# Patient Record
Sex: Male | Born: 1995 | Marital: Married | Smoking: Never smoker
Health system: Southern US, Community
[De-identification: ages and names within clinical notes are randomized; demographics above are authoritative.]

## PROBLEM LIST (undated history)

## (undated) DIAGNOSIS — F988 Other specified behavioral and emotional disorders with onset usually occurring in childhood and adolescence: Secondary | ICD-10-CM

## (undated) DIAGNOSIS — R471 Dysarthria and anarthria: Secondary | ICD-10-CM

## (undated) DIAGNOSIS — F32A Depression, unspecified: Secondary | ICD-10-CM

## (undated) DIAGNOSIS — G4700 Insomnia, unspecified: Secondary | ICD-10-CM

## (undated) DIAGNOSIS — S069XAA Unspecified intracranial injury with loss of consciousness status unknown, initial encounter: Secondary | ICD-10-CM

## (undated) DIAGNOSIS — F411 Generalized anxiety disorder: Secondary | ICD-10-CM

## (undated) DIAGNOSIS — J45909 Unspecified asthma, uncomplicated: Secondary | ICD-10-CM

## (undated) DIAGNOSIS — K219 Gastro-esophageal reflux disease without esophagitis: Secondary | ICD-10-CM

## (undated) DIAGNOSIS — S069X9A Unspecified intracranial injury with loss of consciousness of unspecified duration, initial encounter: Secondary | ICD-10-CM

## (undated) HISTORY — DX: Depression, unspecified: F32.A

## (undated) HISTORY — DX: Other specified behavioral and emotional disorders with onset usually occurring in childhood and adolescence: F98.8

## (undated) HISTORY — DX: Dysarthria and anarthria: R47.1

## (undated) HISTORY — DX: Unspecified intracranial injury with loss of consciousness of unspecified duration, initial encounter: S06.9X9A

## (undated) HISTORY — DX: Gastro-esophageal reflux disease without esophagitis: K21.9

## (undated) HISTORY — PX: DENTAL SURGERY: SHX609

## (undated) HISTORY — DX: Unspecified intracranial injury with loss of consciousness status unknown, initial encounter: S06.9XAA

## (undated) HISTORY — PX: OTHER SURGICAL HISTORY: SHX169

## (undated) HISTORY — DX: Insomnia, unspecified: G47.00

## (undated) HISTORY — DX: Generalized anxiety disorder: F41.1

---

## 2015-01-24 IMAGING — US US ABDOMEN COMPLETE
1 series · 14 of 25 positions shown · non-contrast
Comparison: None.

CLINICAL DATA: Right upper quadrant abdominal pain

EXAM:
ABDOMEN ULTRASOUND COMPLETE

[Series 1: us abdomen complete · 0.19mm/px · 14 of 97 slices shown]
[im 1/97]
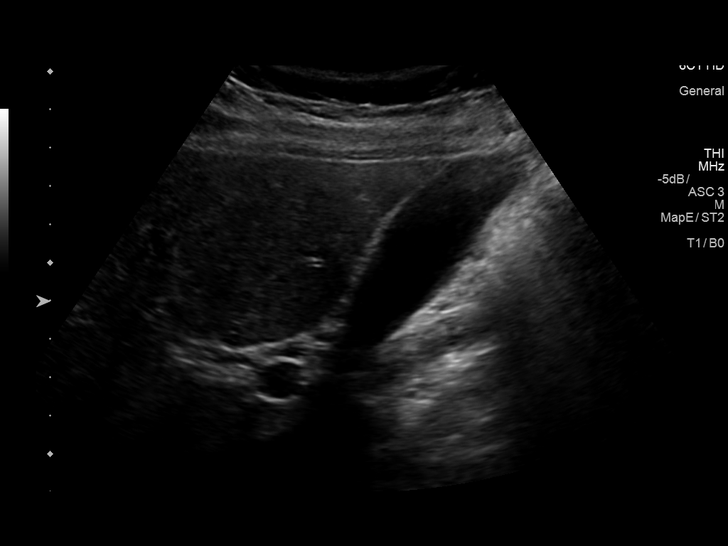
[im 9/97]
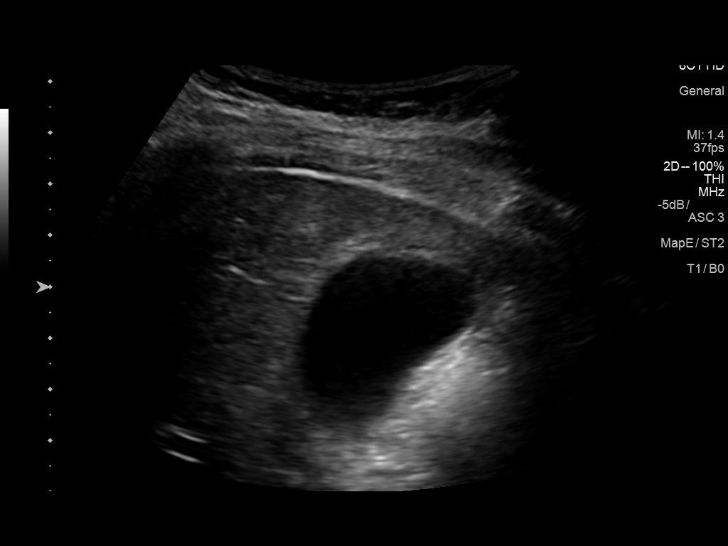
[im 17/97]
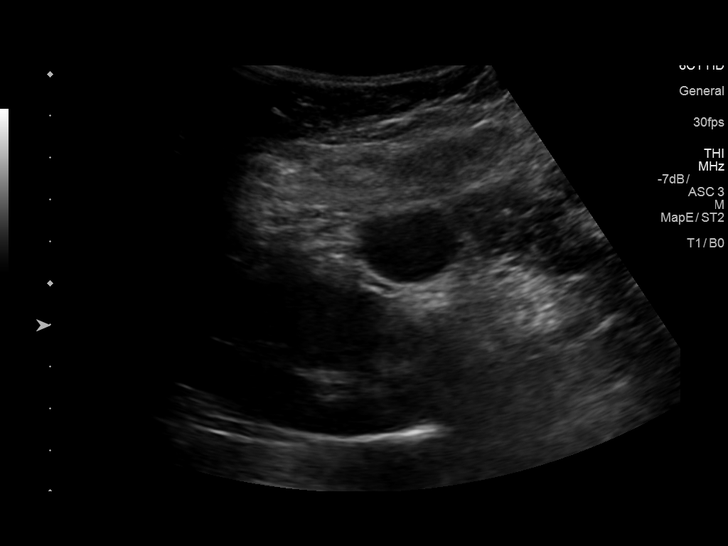
[im 25/97]
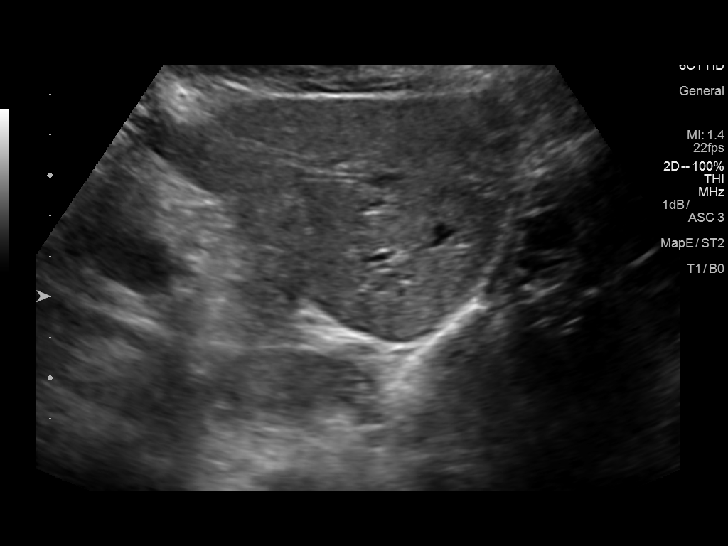
[im 33/97]
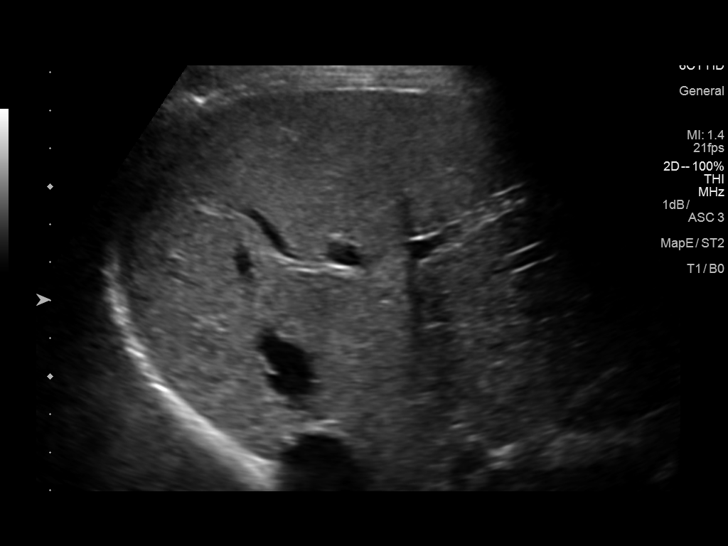
[im 37/97]
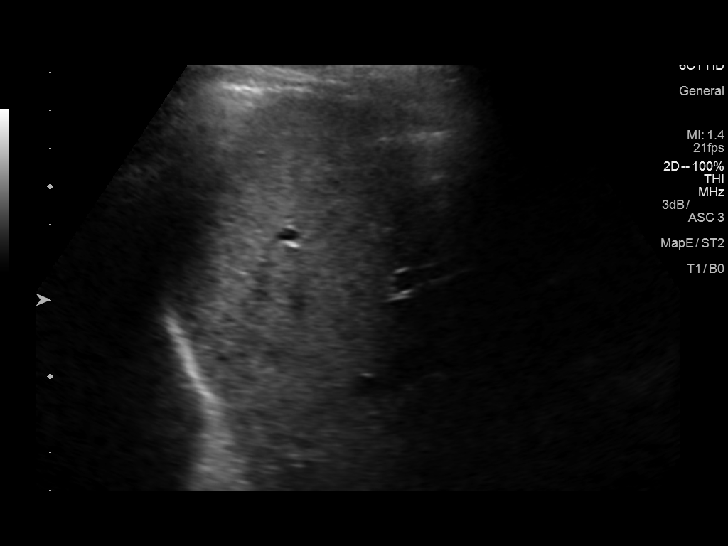
[im 45/97]
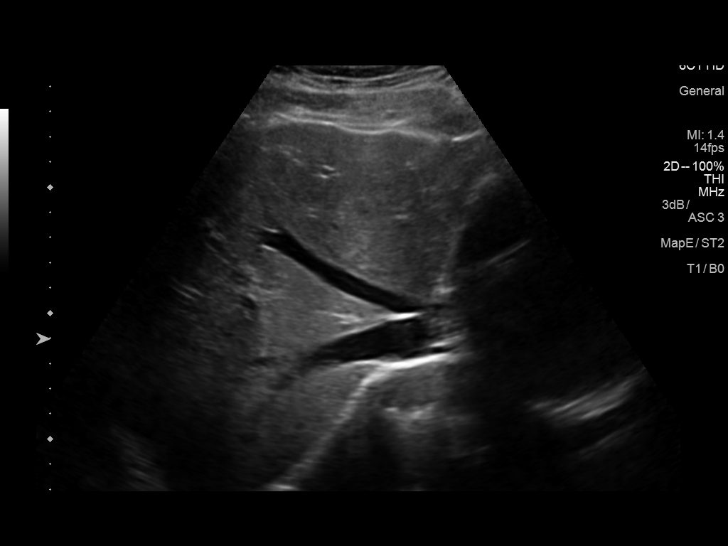
[im 53/97]
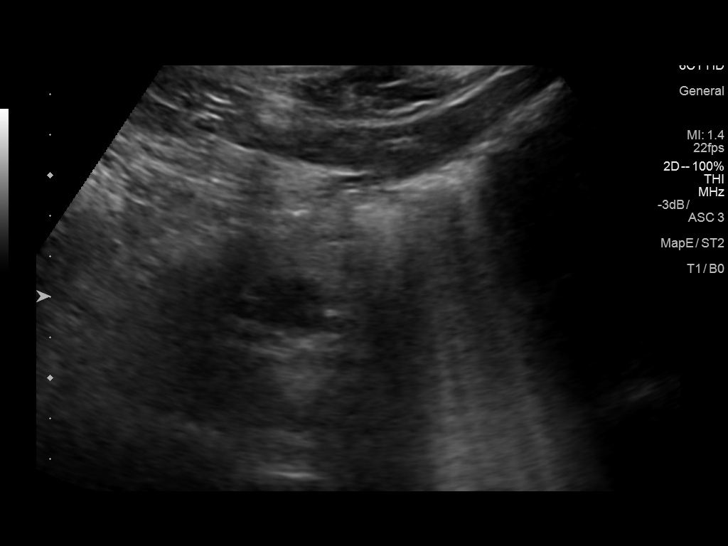
[im 61/97]
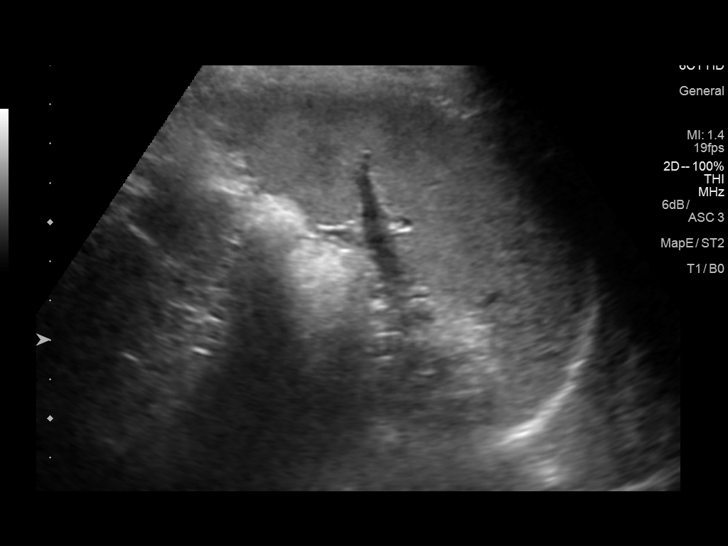
[im 65/97]
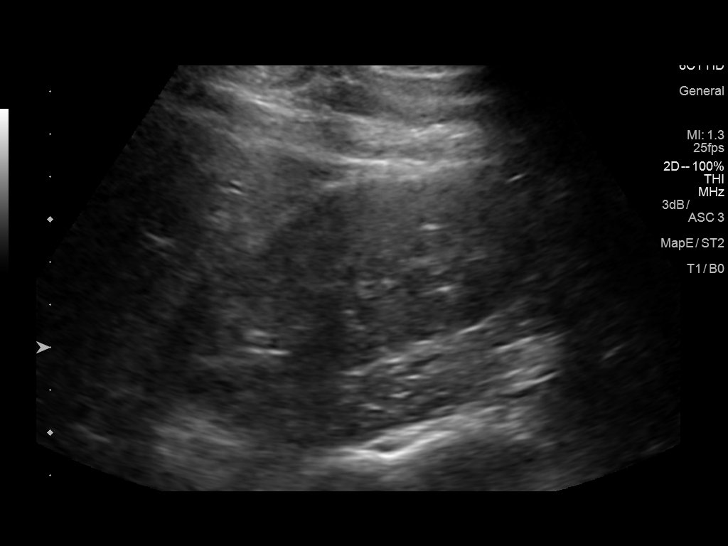
[im 73/97]
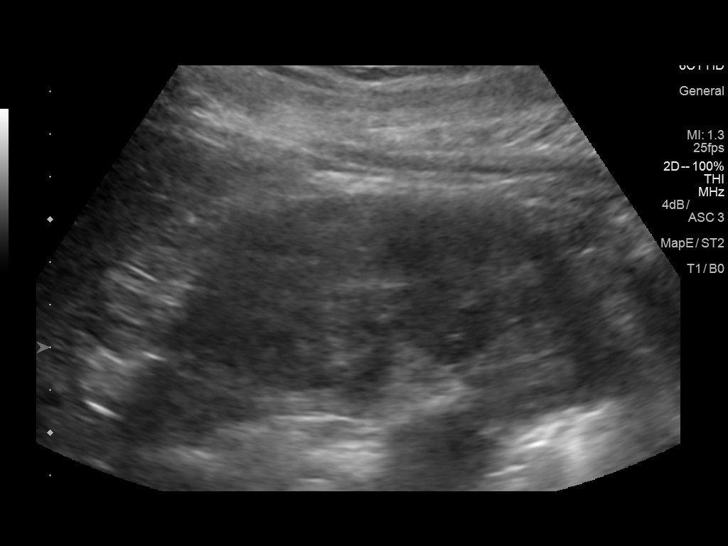
[im 81/97]
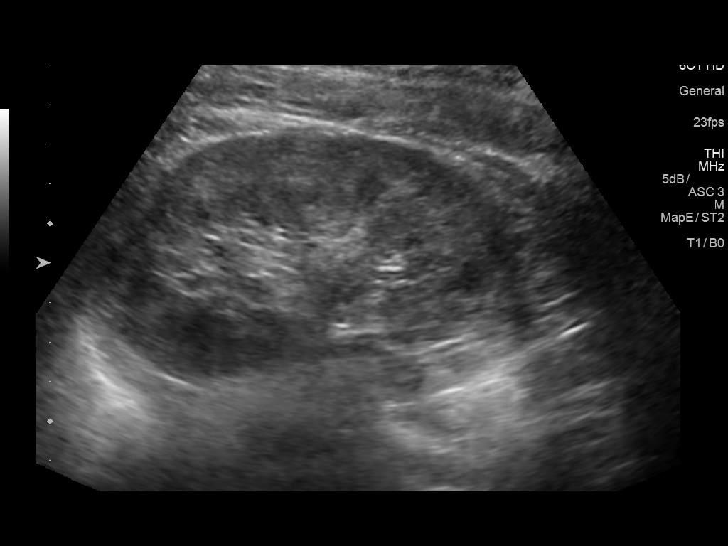
[im 89/97]
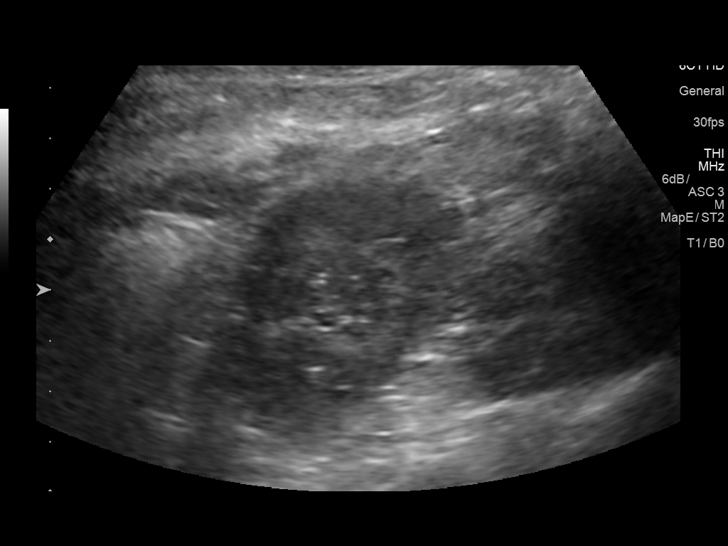
[im 97/97]
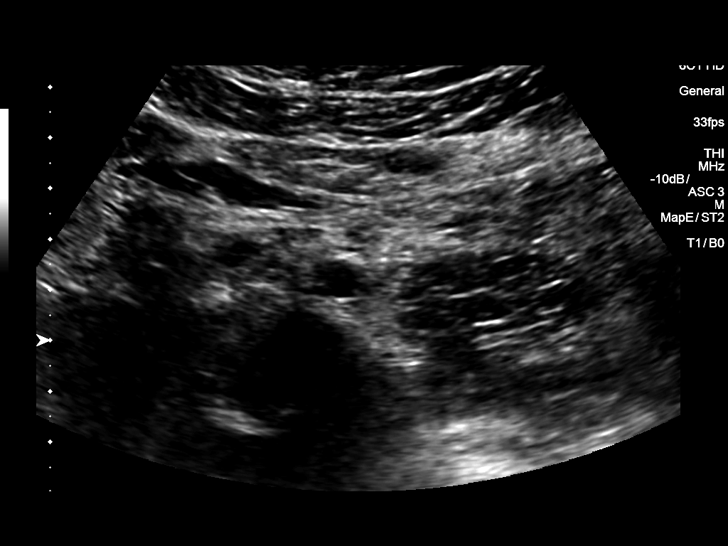

[14 of 25 positions shown; findings below may reference images not displayed]

FINDINGS: Gallbladder: No gallstones or wall thickening visualized. No
sonographic Murphy sign noted by sonographer.

Common bile duct: Diameter: 4.1 mm.

Liver: No focal lesion identified. Within normal limits in
parenchymal echogenicity. Portal flow is within normal limits.

IVC: No abnormality visualized.

Pancreas: Visualized portion unremarkable.

Spleen: Size and appearance within normal limits.

Right Kidney: Length: 11 cm. Echogenicity within normal limits. No
mass or hydronephrosis visualized.

Left Kidney: Length: 10.8 cm. Echogenicity within normal limits. No
mass or hydronephrosis visualized.

Abdominal aorta: No aneurysm visualized.

Other findings: None.
IMPRESSION: No acute abnormality noted.

## 2016-08-06 ENCOUNTER — Other Ambulatory Visit: Payer: Self-pay | Admitting: Family Medicine

## 2016-08-06 DIAGNOSIS — R1011 Right upper quadrant pain: Secondary | ICD-10-CM

## 2016-08-14 ENCOUNTER — Ambulatory Visit
Admission: RE | Admit: 2016-08-14 | Discharge: 2016-08-14 | Disposition: A | Discharge status: Home / Self Care | Payer: 59 | Source: Ambulatory Visit | Attending: Family Medicine | Admitting: Family Medicine

## 2016-08-14 DIAGNOSIS — R1011 Right upper quadrant pain: Secondary | ICD-10-CM

## 2019-01-16 ENCOUNTER — Other Ambulatory Visit: Payer: Self-pay

## 2019-01-16 DIAGNOSIS — Z20822 Contact with and (suspected) exposure to covid-19: Secondary | ICD-10-CM

## 2019-01-18 ENCOUNTER — Telehealth: Payer: Self-pay

## 2019-01-18 LAB — NOVEL CORONAVIRUS, NAA: SARS-CoV-2, NAA: NOT DETECTED

## 2019-01-18 NOTE — Telephone Encounter (Signed)
Pt called for covid 19 results. Advised pt results have not come back.

## 2019-01-19 ENCOUNTER — Telehealth: Payer: Self-pay | Admitting: Family Medicine

## 2019-01-19 NOTE — Telephone Encounter (Signed)
Negative COVID results given. Patient results "NOT Detected." Caller expressed understanding. ° °

## 2019-03-11 ENCOUNTER — Other Ambulatory Visit: Payer: Self-pay

## 2019-03-11 DIAGNOSIS — Z20822 Contact with and (suspected) exposure to covid-19: Secondary | ICD-10-CM

## 2019-03-13 LAB — NOVEL CORONAVIRUS, NAA: SARS-CoV-2, NAA: NOT DETECTED

## 2019-03-25 ENCOUNTER — Ambulatory Visit: Payer: 59 | Attending: Internal Medicine

## 2019-03-25 DIAGNOSIS — Z20822 Contact with and (suspected) exposure to covid-19: Secondary | ICD-10-CM

## 2019-03-26 LAB — NOVEL CORONAVIRUS, NAA: SARS-CoV-2, NAA: NOT DETECTED

## 2019-03-30 ENCOUNTER — Ambulatory Visit: Payer: 59 | Attending: Internal Medicine

## 2019-03-30 DIAGNOSIS — Z20822 Contact with and (suspected) exposure to covid-19: Secondary | ICD-10-CM

## 2019-04-01 LAB — NOVEL CORONAVIRUS, NAA: SARS-CoV-2, NAA: NOT DETECTED

## 2019-04-06 ENCOUNTER — Other Ambulatory Visit: Payer: 59

## 2019-04-06 ENCOUNTER — Ambulatory Visit: Payer: No Typology Code available for payment source | Attending: Internal Medicine

## 2019-04-06 DIAGNOSIS — Z20822 Contact with and (suspected) exposure to covid-19: Secondary | ICD-10-CM | POA: Insufficient documentation

## 2019-04-07 LAB — NOVEL CORONAVIRUS, NAA: SARS-CoV-2, NAA: NOT DETECTED

## 2019-04-20 ENCOUNTER — Other Ambulatory Visit: Payer: Self-pay

## 2019-05-14 DIAGNOSIS — M7731 Calcaneal spur, right foot: Secondary | ICD-10-CM | POA: Diagnosis not present

## 2019-05-14 DIAGNOSIS — M7732 Calcaneal spur, left foot: Secondary | ICD-10-CM | POA: Diagnosis not present

## 2019-05-14 DIAGNOSIS — M722 Plantar fascial fibromatosis: Secondary | ICD-10-CM | POA: Diagnosis not present

## 2019-05-22 DIAGNOSIS — M722 Plantar fascial fibromatosis: Secondary | ICD-10-CM | POA: Diagnosis not present

## 2019-05-28 DIAGNOSIS — F329 Major depressive disorder, single episode, unspecified: Secondary | ICD-10-CM | POA: Diagnosis not present

## 2019-05-28 DIAGNOSIS — F900 Attention-deficit hyperactivity disorder, predominantly inattentive type: Secondary | ICD-10-CM | POA: Diagnosis not present

## 2019-05-28 DIAGNOSIS — M71571 Other bursitis, not elsewhere classified, right ankle and foot: Secondary | ICD-10-CM | POA: Diagnosis not present

## 2019-05-28 DIAGNOSIS — M71572 Other bursitis, not elsewhere classified, left ankle and foot: Secondary | ICD-10-CM | POA: Diagnosis not present

## 2019-05-28 DIAGNOSIS — M722 Plantar fascial fibromatosis: Secondary | ICD-10-CM | POA: Diagnosis not present

## 2019-05-28 DIAGNOSIS — F411 Generalized anxiety disorder: Secondary | ICD-10-CM | POA: Diagnosis not present

## 2019-06-09 DIAGNOSIS — M722 Plantar fascial fibromatosis: Secondary | ICD-10-CM | POA: Diagnosis not present

## 2019-08-16 ENCOUNTER — Emergency Department (HOSPITAL_COMMUNITY): Payer: BC Managed Care – PPO

## 2019-08-16 ENCOUNTER — Emergency Department (HOSPITAL_COMMUNITY)
Admission: EM | Admit: 2019-08-16 | Discharge: 2019-08-16 | Disposition: A | Discharge status: Home / Self Care | Payer: BC Managed Care – PPO | Attending: Emergency Medicine | Admitting: Emergency Medicine

## 2019-08-16 ENCOUNTER — Encounter (HOSPITAL_COMMUNITY): Payer: Self-pay

## 2019-08-16 ENCOUNTER — Other Ambulatory Visit: Payer: Self-pay

## 2019-08-16 DIAGNOSIS — S0990XA Unspecified injury of head, initial encounter: Secondary | ICD-10-CM | POA: Insufficient documentation

## 2019-08-16 DIAGNOSIS — Y998 Other external cause status: Secondary | ICD-10-CM | POA: Insufficient documentation

## 2019-08-16 DIAGNOSIS — S060X9A Concussion with loss of consciousness of unspecified duration, initial encounter: Secondary | ICD-10-CM

## 2019-08-16 DIAGNOSIS — M542 Cervicalgia: Secondary | ICD-10-CM | POA: Diagnosis not present

## 2019-08-16 DIAGNOSIS — Y92410 Unspecified street and highway as the place of occurrence of the external cause: Secondary | ICD-10-CM | POA: Insufficient documentation

## 2019-08-16 DIAGNOSIS — J45909 Unspecified asthma, uncomplicated: Secondary | ICD-10-CM | POA: Diagnosis not present

## 2019-08-16 DIAGNOSIS — R519 Headache, unspecified: Secondary | ICD-10-CM

## 2019-08-16 DIAGNOSIS — Y9389 Activity, other specified: Secondary | ICD-10-CM | POA: Insufficient documentation

## 2019-08-16 DIAGNOSIS — S060XAA Concussion with loss of consciousness status unknown, initial encounter: Secondary | ICD-10-CM

## 2019-08-16 DIAGNOSIS — S199XXA Unspecified injury of neck, initial encounter: Secondary | ICD-10-CM | POA: Diagnosis not present

## 2019-08-16 HISTORY — DX: Unspecified asthma, uncomplicated: J45.909

## 2019-08-16 HISTORY — DX: Concussion with loss of consciousness of unspecified duration, initial encounter: S06.0X9A

## 2019-08-16 HISTORY — DX: Concussion with loss of consciousness status unknown, initial encounter: S06.0XAA
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
## 2019-08-16 IMAGING — MR MR MRA HEAD W/O CM
1 series · 20 of 48 positions shown · non-contrast
Comparison: None.

CLINICAL DATA: Head trauma with headache

EXAM:
MRA HEAD WITHOUT CONTRAST
TECHNIQUE: Angiographic images of the Circle of Willis were obtained using MRA
technique without intravenous contrast.

[Series 8: TOF · axial · 0.6mm · 0.35mm/px · z∈[-55,+43]mm · 20 of 172 slices shown]
[im 1/172]
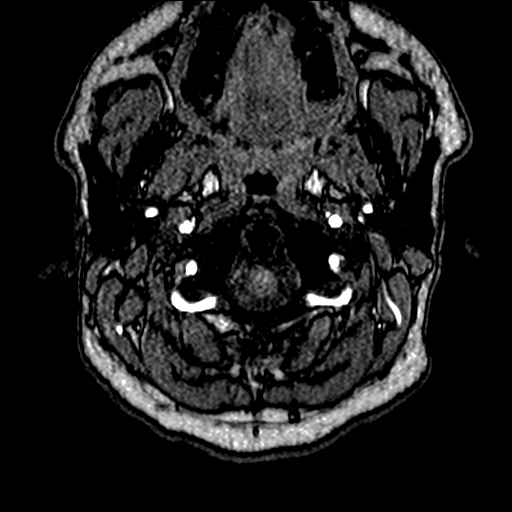
[im 4/172]
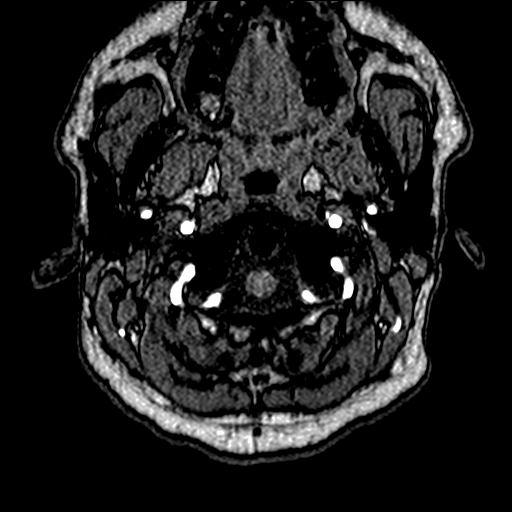
[im 8/172]
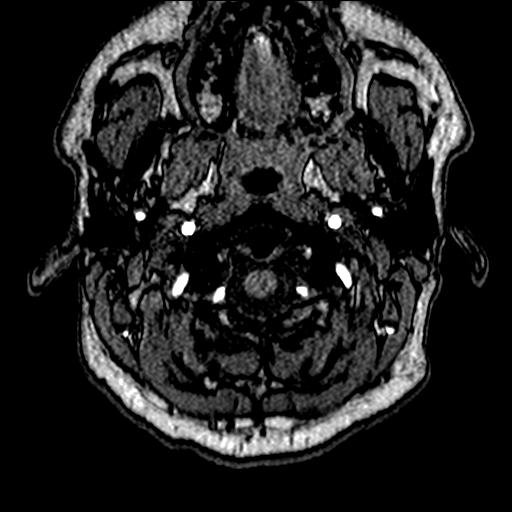
[im 11/172]
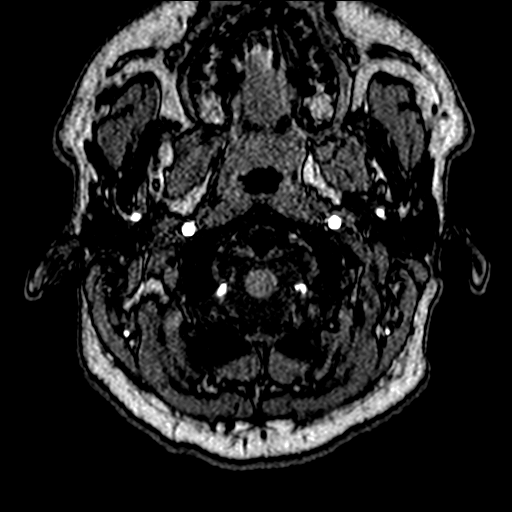
[im 15/172]
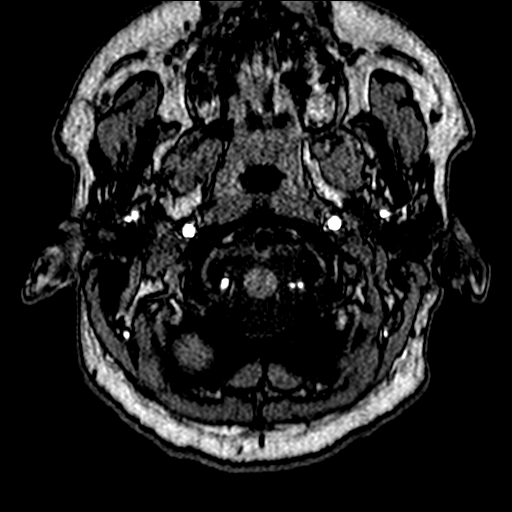
[im 19/172]
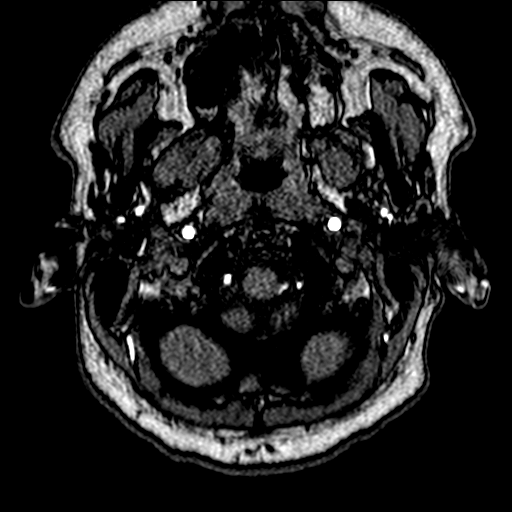
[im 22/172]
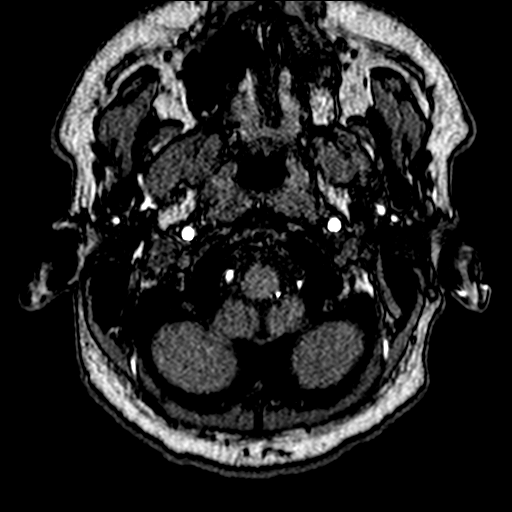
[im 26/172]
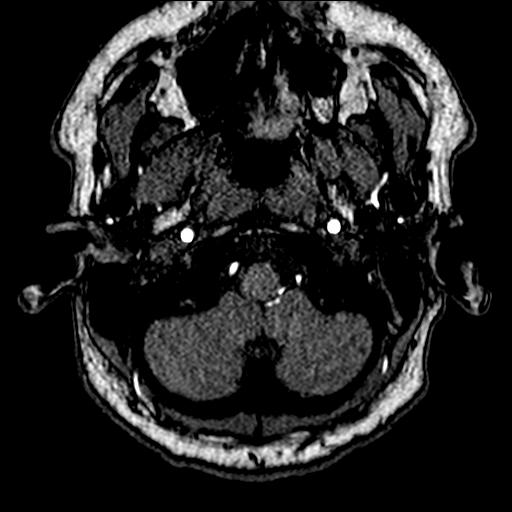
[im 30/172]
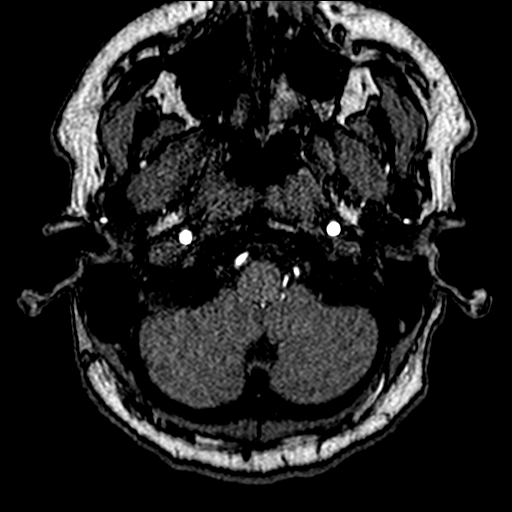
[im 33/172]
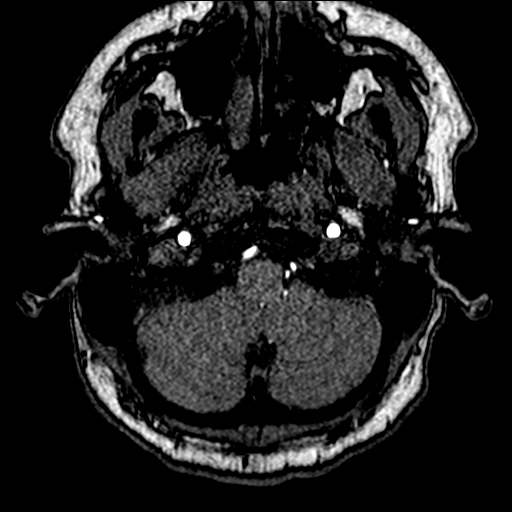
[im 37/172]
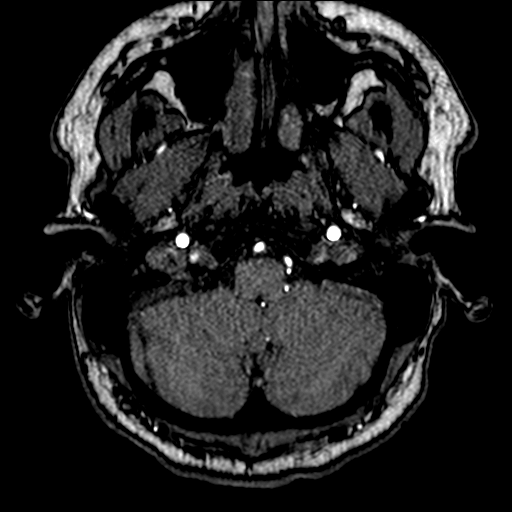
[im 41/172]
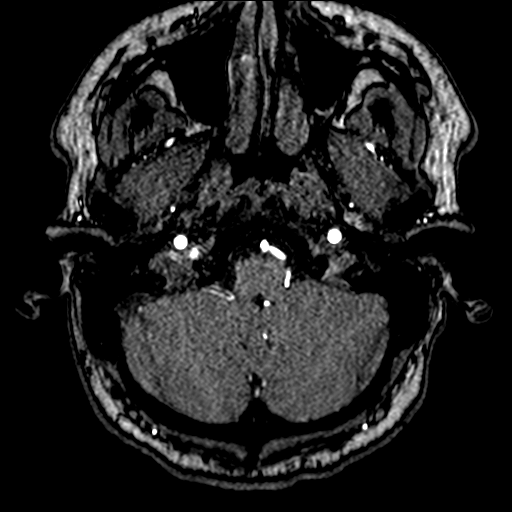
[im 55/172]
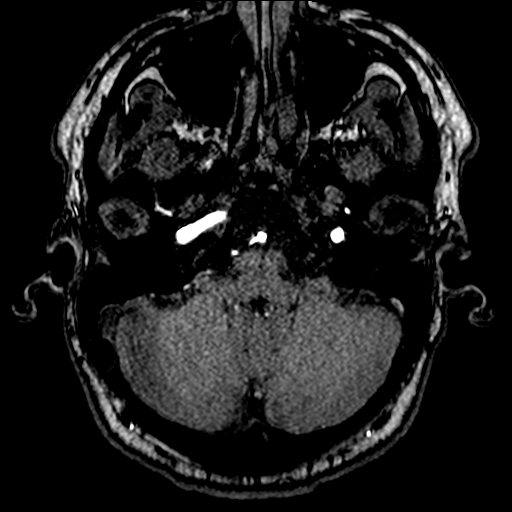
[im 77/172]
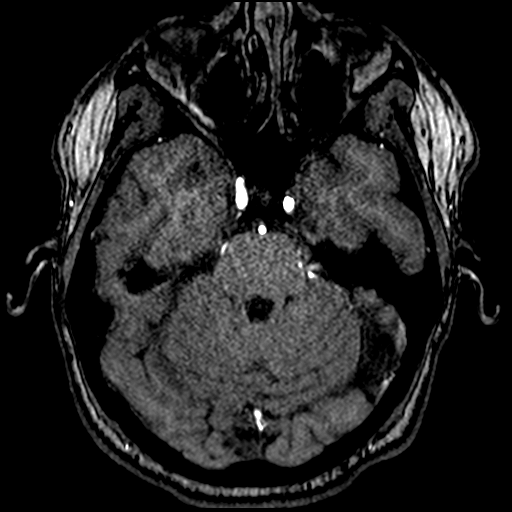
[im 88/172]
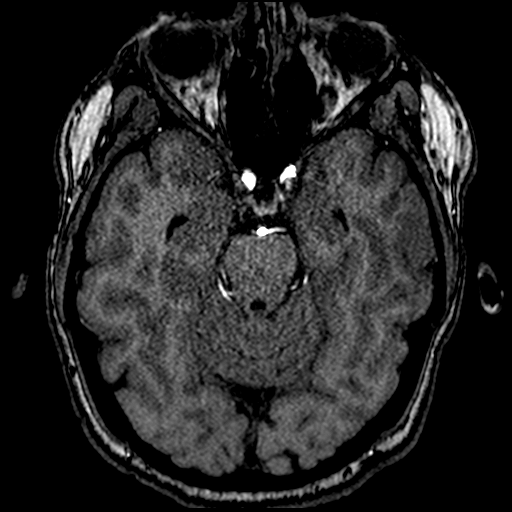
[im 99/172]
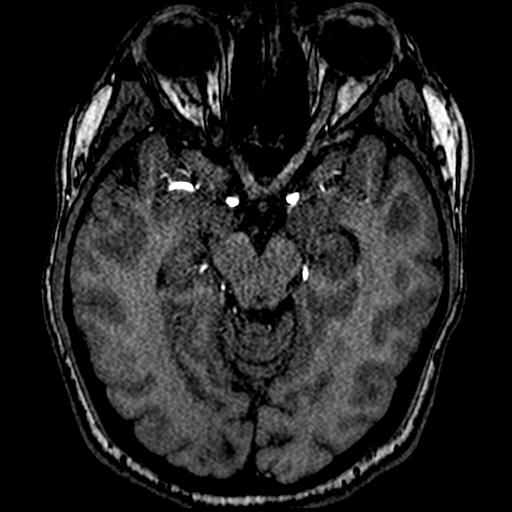
[im 121/172]
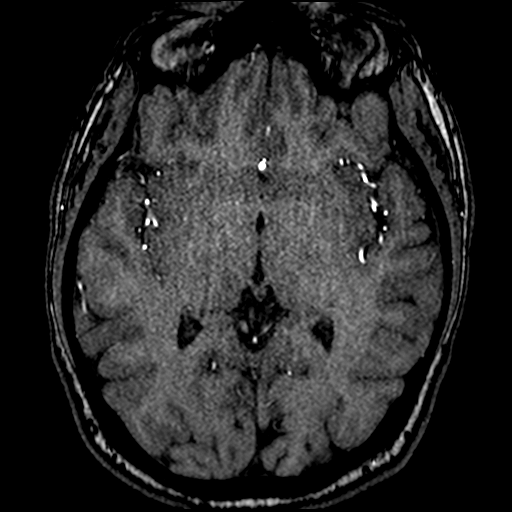
[im 142/172]
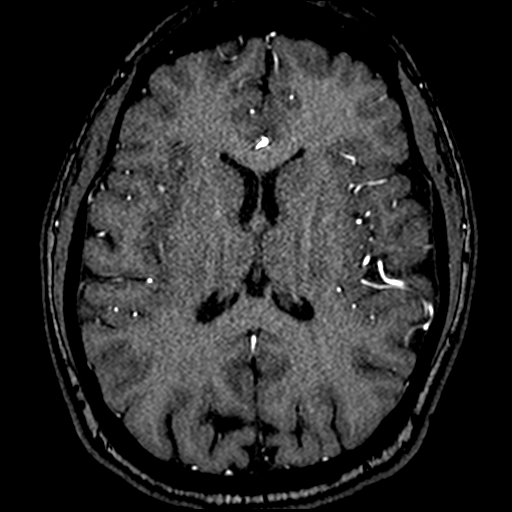
[im 146/172]
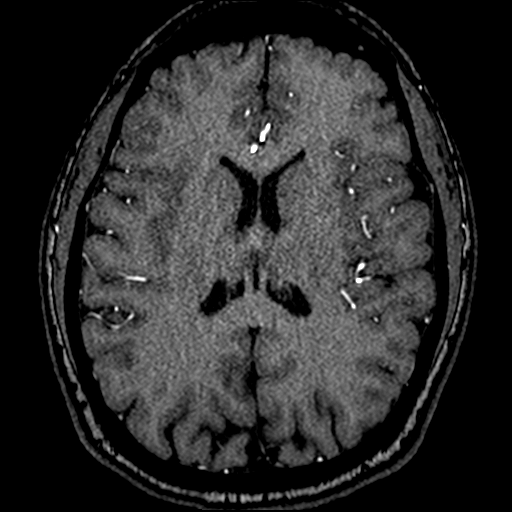
[im 164/172]
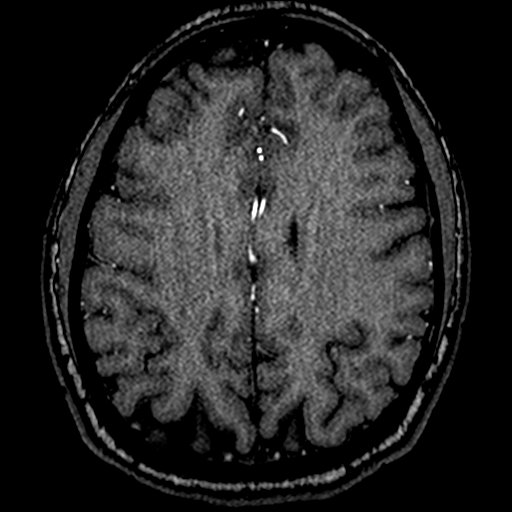

[20 of 48 positions shown; findings below may reference images not displayed]

FINDINGS: POSTERIOR CIRCULATION:

--Vertebral arteries: Normal V4 segments.

--Inferior cerebellar arteries: Normal.

--Basilar artery: Normal.

--Superior cerebellar arteries: Normal.

--Posterior cerebral arteries: Normal. There are bilateral posterior
communicating arteries (p-comm) that partially supply the PCAs.

ANTERIOR CIRCULATION:

--Intracranial internal carotid arteries: Normal.

--Anterior cerebral arteries (ACA): Normal. Both A1 segments are
present. Patent anterior communicating artery (a-comm).

--Middle cerebral arteries (MCA): Normal.
IMPRESSION: Normal intracranial MRA.

## 2019-08-16 MED ORDER — ACETAMINOPHEN 325 MG PO TABS: 650.0000 mg | ORAL_TABLET | Freq: Once | ORAL | Status: DC

## 2019-08-16 MED ORDER — DIAZEPAM 5 MG PO TABS
5.0000 mg | ORAL_TABLET | Freq: Once | ORAL | Status: AC
  Administered 2019-08-16: 5 mg via ORAL
  Filled 2019-08-16: qty 1

## 2019-08-16 MED ORDER — GADOBUTROL 1 MMOL/ML IV SOLN
8.0000 mL | Freq: Once | INTRAVENOUS | Status: AC | PRN
  Administered 2019-08-16: 8 mL via INTRAVENOUS
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
## 2019-08-16 NOTE — ED Notes (Signed)
Pt transported to MRI 
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
## 2019-08-16 NOTE — Discharge Instructions (Signed)
Please take tylenol and ibuprofen for management of your pain.  I would recommend that you follow up with your primary care provider as soon as possible to discuss this visit as well as your symptoms.   Please return to the emergency department if your symptoms worsen.   It was a pleasure to meet you.

## 2019-08-16 NOTE — ED Notes (Signed)
Pt verbalizes understanding of DC instructions. Pt belongings returned and is ambulatory out of ED.  
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
## 2019-08-17 ENCOUNTER — Encounter (HOSPITAL_COMMUNITY): Payer: Self-pay

## 2019-08-17 ENCOUNTER — Emergency Department (HOSPITAL_COMMUNITY): Payer: BC Managed Care – PPO

## 2019-08-17 ENCOUNTER — Other Ambulatory Visit: Payer: Self-pay

## 2019-08-17 ENCOUNTER — Emergency Department (HOSPITAL_COMMUNITY)
Admission: EM | Admit: 2019-08-17 | Discharge: 2019-08-17 | Disposition: A | Discharge status: Home / Self Care | Payer: BC Managed Care – PPO | Attending: Emergency Medicine | Admitting: Emergency Medicine

## 2019-08-17 DIAGNOSIS — Y93I9 Activity, other involving external motion: Secondary | ICD-10-CM | POA: Insufficient documentation

## 2019-08-17 DIAGNOSIS — J45909 Unspecified asthma, uncomplicated: Secondary | ICD-10-CM | POA: Insufficient documentation

## 2019-08-17 DIAGNOSIS — Y999 Unspecified external cause status: Secondary | ICD-10-CM | POA: Insufficient documentation

## 2019-08-17 DIAGNOSIS — S060X0A Concussion without loss of consciousness, initial encounter: Secondary | ICD-10-CM | POA: Insufficient documentation

## 2019-08-17 DIAGNOSIS — Y92410 Unspecified street and highway as the place of occurrence of the external cause: Secondary | ICD-10-CM | POA: Insufficient documentation

## 2019-08-17 DIAGNOSIS — R519 Headache, unspecified: Secondary | ICD-10-CM | POA: Diagnosis not present

## 2019-08-17 DIAGNOSIS — S0990XA Unspecified injury of head, initial encounter: Secondary | ICD-10-CM | POA: Diagnosis not present
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
## 2019-08-17 MED ORDER — ACETAMINOPHEN 325 MG PO TABS
650.0000 mg | ORAL_TABLET | Freq: Once | ORAL | Status: AC
  Administered 2019-08-17: 650 mg via ORAL
  Filled 2019-08-17: qty 2

## 2019-08-17 NOTE — ED Notes (Signed)
Pt sitting up in bed talking to mother on cell phone.
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
## 2019-08-17 NOTE — Discharge Instructions (Addendum)
Please rest - rest helps your brain heal. So get plenty of sleep and you can take frequent naps if needed Avoid any high energy physical exercise or mental activities that require a lot of concentration Please set up an appointment at the concussion clinic for follow up

## 2019-08-17 NOTE — ED Notes (Signed)
Patient verbalizes understanding of discharge instructions . Opportunity for questions and answers were provided . Armband removed by staff ,Pt discharged from ED. W/C  offered at D/C  and Declined W/C at D/C and was escorted to lobby by RN.  
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
## 2019-08-19 ENCOUNTER — Telehealth: Payer: Self-pay
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
## 2019-08-20 ENCOUNTER — Encounter: Payer: Self-pay | Admitting: Family Medicine

## 2019-08-20 ENCOUNTER — Other Ambulatory Visit: Payer: Self-pay

## 2019-08-20 ENCOUNTER — Ambulatory Visit (INDEPENDENT_AMBULATORY_CARE_PROVIDER_SITE_OTHER): Payer: BC Managed Care – PPO | Admitting: Family Medicine

## 2019-08-20 VITALS — BP 110/82 | HR 86 | Ht 67.0 in | Wt 178.4 lb

## 2019-08-20 DIAGNOSIS — R471 Dysarthria and anarthria: Secondary | ICD-10-CM | POA: Diagnosis not present

## 2019-08-20 DIAGNOSIS — M25512 Pain in left shoulder: Secondary | ICD-10-CM

## 2019-08-20 DIAGNOSIS — R278 Other lack of coordination: Secondary | ICD-10-CM | POA: Diagnosis not present

## 2019-08-20 DIAGNOSIS — S060X0A Concussion without loss of consciousness, initial encounter: Secondary | ICD-10-CM | POA: Diagnosis not present

## 2019-08-20 DIAGNOSIS — M25532 Pain in left wrist: Secondary | ICD-10-CM

## 2019-08-20 MED ORDER — AMITRIPTYLINE HCL 25 MG PO TABS
25.0000 mg | ORAL_TABLET | Freq: Every day | ORAL | 1 refills | Status: DC
Start: 2019-08-20 — End: 2019-09-01
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
## 2019-08-20 NOTE — Patient Instructions (Addendum)
Thank you for coming in today. Plan for speech therapy and neurorehab here in town.  Complete paperwork for [facility].  Try amitriptyline at bedtime for headache prevention.  Recheck in 2 weeks.  Contact me or return sooner if needed.   I can help fill out forms as well.   L shoulder exercises: Please perform the exercise program that we have prepared for you and gone over in detail on a daily basis.  In addition to the handout you were provided you can access your program through: [email or web address]   Your unique program code is: [email or web address]

## 2019-08-24 ENCOUNTER — Ambulatory Visit: Payer: BC Managed Care – PPO | Admitting: Speech Pathology

## 2019-08-24 ENCOUNTER — Other Ambulatory Visit: Payer: Self-pay

## 2019-08-24 ENCOUNTER — Encounter: Payer: Self-pay | Admitting: Speech Pathology

## 2019-08-24 ENCOUNTER — Ambulatory Visit: Payer: BC Managed Care – PPO | Attending: Family Medicine

## 2019-08-24 DIAGNOSIS — M6281 Muscle weakness (generalized): Secondary | ICD-10-CM

## 2019-08-24 DIAGNOSIS — R471 Dysarthria and anarthria: Secondary | ICD-10-CM

## 2019-08-24 DIAGNOSIS — R42 Dizziness and giddiness: Secondary | ICD-10-CM

## 2019-08-24 DIAGNOSIS — G44319 Acute post-traumatic headache, not intractable: Secondary | ICD-10-CM

## 2019-08-24 DIAGNOSIS — R2689 Other abnormalities of gait and mobility: Secondary | ICD-10-CM

## 2019-08-24 NOTE — Patient Instructions (Signed)
  3 ring binder for therapy  To help others understand you:  Talk face to face Eliminate background noise Over ennunciate Pause frequently  Tips to help facilitate better attention, concentration, focus   Do harder, longer tasks when you are most alert/awake  Break down larger tasks into small parts  Limit distractions of TV, radio, conversation, e mails/texts, appliance noise, etc - if a job is important, do it in a quiet room  Be aware of how you are functioning in high stimulation environments such as large stores, parties, restaurants - any place with lots of lights, noise, signs etc (sunglasses, ball cap if needed)  Group conversations may be more difficult to process than one on one conversations  Give yourself extra time to process conversation, reading materials, directions or information from your healthcare providers  Organization is key - clutters of laundry, mail, paperwork, dirty dishes - all make it more difficult to concentrate  Before you start a task, have all the needed supplies, directions, recipes ready and organized. This way you don't have to go looking for something in the middle of a task and become distracted.   Be aware of fatigue - take rests or breaks when needed to re-group and re-focus  To help Jordan Robinson process conversation/speech:  Get the persons attention before you speak  Use eye contact and face the person you are speaking to  Be in close proximity to the person you are speaking to  Turn down any noise in the environment such as the TV, walk away from loud appliances, air conditioners, fans, dish washers etc    SLOW LOUD OVER-ENNUNCIATE PAUSE  PA TA KA  PATA TAK[place] PATAKA  BUTTERCUP  CATERPILLAR  BASEBALLL PLAYER  [place]  [place]  SLOW AND BIG - EXAGGERATE YOUR MOUTH, MAKE EACH CONSONANT  Speech exercises - do 5x each, x2-3/day SLOW BIG  SAY THE FOLLOWING- make every sound! Red leather, yellow  leather     Purple baby carriage    [place] [employer] Proper copper coffee pot Ripe purple cabbage Three free throws [place] [employer] [employer] Container, Blue Bulb Flash Message Six Thick Thistles Stick Double Bubble Gum Cinnamon aluminum linoleum Black bugs blood Lovely lemon linament Tying Tape Takes Time A Shifty Salt Shaker   Four floors to cover Unique [place] A Three Toed Tree Toad Knapsack Strap Snap Rubber Baby Buggy Bumpers [place]  Seven Salty Sailors Sailed the [place] Which Wrist Watches are [place] Wrist Watches
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
## 2019-08-26 ENCOUNTER — Encounter: Payer: Self-pay | Admitting: Family Medicine

## 2019-08-26 DIAGNOSIS — F649 Gender identity disorder, unspecified: Secondary | ICD-10-CM | POA: Diagnosis not present

## 2019-08-26 DIAGNOSIS — Z79899 Other long term (current) drug therapy: Secondary | ICD-10-CM | POA: Diagnosis not present

## 2019-08-27 ENCOUNTER — Other Ambulatory Visit: Payer: Self-pay

## 2019-08-27 ENCOUNTER — Ambulatory Visit: Payer: BC Managed Care – PPO

## 2019-08-27 ENCOUNTER — Telehealth: Payer: Self-pay | Admitting: Family Medicine

## 2019-08-27 ENCOUNTER — Telehealth: Payer: Self-pay

## 2019-08-27 DIAGNOSIS — R2689 Other abnormalities of gait and mobility: Secondary | ICD-10-CM

## 2019-08-27 DIAGNOSIS — M6281 Muscle weakness (generalized): Secondary | ICD-10-CM | POA: Diagnosis not present

## 2019-08-27 DIAGNOSIS — R42 Dizziness and giddiness: Secondary | ICD-10-CM

## 2019-08-27 DIAGNOSIS — G44319 Acute post-traumatic headache, not intractable: Secondary | ICD-10-CM | POA: Diagnosis not present

## 2019-08-27 DIAGNOSIS — R471 Dysarthria and anarthria: Secondary | ICD-10-CM

## 2019-08-27 NOTE — Telephone Encounter (Signed)
Pt called, would like to fly to [facility] for the holiday weekend and wants to be sure Dr. Denyse Amass feels this is safe for him to do.
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
## 2019-08-27 NOTE — Patient Instructions (Signed)
Access Code: [facility] URL: [email or web address] Date: 08/27/2019 Prepared by: Elmer Bales  Exercises Supine Lower Trunk Rotation - 1 x daily - 7 x weekly - 2 sets - 10 reps Hooklying Single Knee to Chest Stretch - 2 x daily - 7 x weekly - 1 sets - 3 reps - 30 sec hold Standing in corner eyes open and eyes closed - 2 x daily - 7 x weekly - 1 sets - 3 reps - 20-30 sec hold Seated March - 2 x daily - 7 x weekly - 2 sets - 10 reps Standing Marching - 2 x daily - 7 x weekly - 2 sets - 10 reps

## 2019-08-27 NOTE — Telephone Encounter (Signed)
Dr. Denyse Amass, Kyung Bacca was evaluated by PT on 08/24/19.  The patient would benefit from OT evaluation for UE coordination deficits and left shoulder/wrist pain. If you agree, please place an order in [facility] workque in [facility] or fax the order to [phone]. Thank you, Elmer Bales, PT, DPT, NCS  [facility] [address] [place], [place]  [address] Phone:  [phone] Fax:  [phone]

## 2019-08-28 NOTE — Telephone Encounter (Signed)
If he thinks he can fly then I think it would be ok.

## 2019-08-28 NOTE — Telephone Encounter (Signed)
OT added

## 2019-08-28 NOTE — Telephone Encounter (Signed)
Pt informed of provider response via VM.

## 2019-08-28 NOTE — Telephone Encounter (Signed)
Thanks so much! Have a great weekend!

## 2019-09-01 MED ORDER — NORTRIPTYLINE HCL 25 MG PO CAPS: 25.0000 mg | ORAL_CAPSULE | Freq: Every evening | ORAL | 2 refills | Status: DC | PRN

## 2019-09-02 ENCOUNTER — Ambulatory Visit: Payer: BC Managed Care – PPO | Admitting: Physical Therapy

## 2019-09-02 DIAGNOSIS — S069X9A Unspecified intracranial injury with loss of consciousness of unspecified duration, initial encounter: Secondary | ICD-10-CM | POA: Diagnosis not present

## 2019-09-02 DIAGNOSIS — R471 Dysarthria and anarthria: Secondary | ICD-10-CM | POA: Diagnosis not present

## 2019-09-02 DIAGNOSIS — J302 Other seasonal allergic rhinitis: Secondary | ICD-10-CM | POA: Diagnosis not present

## 2019-09-03 ENCOUNTER — Other Ambulatory Visit: Payer: Self-pay

## 2019-09-03 ENCOUNTER — Ambulatory Visit: Payer: BC Managed Care – PPO | Attending: Family Medicine

## 2019-09-03 DIAGNOSIS — M6281 Muscle weakness (generalized): Secondary | ICD-10-CM

## 2019-09-03 DIAGNOSIS — R471 Dysarthria and anarthria: Secondary | ICD-10-CM | POA: Insufficient documentation

## 2019-09-03 DIAGNOSIS — R2689 Other abnormalities of gait and mobility: Secondary | ICD-10-CM | POA: Diagnosis not present

## 2019-09-03 DIAGNOSIS — R42 Dizziness and giddiness: Secondary | ICD-10-CM
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
## 2019-09-04 ENCOUNTER — Ambulatory Visit: Payer: BC Managed Care – PPO

## 2019-09-04 DIAGNOSIS — S069X9A Unspecified intracranial injury with loss of consciousness of unspecified duration, initial encounter: Secondary | ICD-10-CM | POA: Diagnosis not present

## 2019-09-04 DIAGNOSIS — R2689 Other abnormalities of gait and mobility: Secondary | ICD-10-CM | POA: Diagnosis not present

## 2019-09-04 DIAGNOSIS — R471 Dysarthria and anarthria: Secondary | ICD-10-CM | POA: Diagnosis not present

## 2019-09-04 DIAGNOSIS — F411 Generalized anxiety disorder: Secondary | ICD-10-CM | POA: Diagnosis not present

## 2019-09-04 DIAGNOSIS — M6281 Muscle weakness (generalized): Secondary | ICD-10-CM | POA: Diagnosis not present

## 2019-09-04 DIAGNOSIS — R42 Dizziness and giddiness: Secondary | ICD-10-CM | POA: Diagnosis not present

## 2019-09-04 DIAGNOSIS — T7840XA Allergy, unspecified, initial encounter: Secondary | ICD-10-CM | POA: Diagnosis not present

## 2019-09-04 NOTE — Patient Instructions (Signed)
  Start with singing for about a minute with a straw in water Sing through a straw for another minute without water    SLOW LOUD OVER-ENNUNCIATE PAUSE  PA TA KA  PATA TAKA KAPA PATAKA  BUTTERCUP  CATERPILLAR  BASEBALLL PLAYER  [place]  [place]ANE[place]  SLOW AND BIG - EXAGGERATE YOUR MOUTH, MAKE EACH CONSONANT  Speech exercises - do 5x each, x2-3/day SLOW BIG  SAY THE FOLLOWING- make every sound! Red leather, yellow leather                                   Purple baby carriage                         [place] [employer] Proper copper coffee pot Ripe purple cabbage Three free throws [place] [employer], Blue Bulb Flash Message Six Thick Thistles Stick Double Bubble Gum Cinnamon aluminum linoleum Black bugs blood Lovely lemon linament Tying Tape Takes Time A Shifty Salt Shaker                 Four floors to cover Unique [place] A Three Toed Tree Toad Knapsack Strap Snap Rubber Baby Buggy Bumpers [place]  Seven Salty Sailors Sailed the [place] Which Wrist Watches are [place] Wrist Watches

## 2019-09-04 NOTE — Therapy (Signed)
[facility] [address] [place], [place], [address] Phone: [phone]   Fax:  [phone]  Speech Language Pathology Treatment  Patient Details  Name: Jordan Robinson MRN: [id number] Date of Birth: 10-Mar-1996 Referring Provider (SLP): Dr. Lynne Leader   Encounter Date: 09/04/2019  End of Session - 09/04/19 1646    Visit Number  2    Number of Visits  12    Date for SLP Re-Evaluation  10/05/19    SLP Start Time  8546    SLP Stop Time   1615    SLP Time Calculation (min)  40 min    Activity Tolerance  Patient tolerated treatment well       Past Medical History:  Diagnosis Date   Asthma     Past Surgical History:  Procedure Laterality Date   DENTAL SURGERY     masectomy      There were no vitals filed for this visit.  Subjective Assessment - 09/04/19 1549    Currently in Pain?  Yes    Pain Score  6     Pain Location  Head    Pain Orientation  Right;Left    Pain Descriptors / Indicators  Throbbing;Headache    Pain Type  Acute pain    Pain Onset  1 to 4 weeks ago    Pain Frequency  Constant    Pain Score  5    Pain Location  Head    Pain Orientation  Posterior            ADULT SLP TREATMENT - 09/04/19 1552      General Information   Behavior/Cognition  Alert;Cooperative;Pleasant mood      Treatment Provided   Treatment provided  Cognitive-Linquistic      Cognitive-Linquistic Treatment   Treatment focused on  Voice;Cognition    Skilled Treatment  (cognition 15 minutes) SLP collaboarted with pt about how to make pt's med administration more consistent. SLP provided rare mod A for pt to set alarm the previous day for a time when pt will be up-- prior to leaving for the day. this is pt's homework. (speech tx) SLP worked with pt with pt's dysarthria HEP; pt with atypical vocal pulsating when performing the HEP ("tongue twisters") but not when using straw phonation nor when talking in conversation.  Pt extremely hypernasal today. Denies any sort of difference or difficulty in swallowing       Assessment / Recommendations / Plan   Plan  Continue with current plan of care      Progression Toward Goals   Progression toward goals  Progressing toward goals       SLP Education - 09/04/19 1646    Education Details  perform straw phonation prior to HEP    Person(s) Educated  Patient;Other (comment)    Methods  Explanation    Comprehension  Verbalized understanding         SLP Long Term Goals - 09/04/19 1651      SLP LONG TERM GOAL #1   Title  Pt will complete HEP for dysarthria with rare min A over 2 sessions    Time  6    Period  Weeks    Status  On-going      SLP LONG TERM GOAL #2   Title  Pt will utilize compensations for dysarthira in structured speech tasks with rare min A over 2 sessions    Time  6    Period  Weeks  Status  On-going      SLP LONG TERM GOAL #3   Title  Pt will be 95% intelligible in noisy environment over 20 minute conversation and report 25% reduction (subjectively) for requests for repetition by family with rare min A over 2 sessions    Time  6    Period  Weeks    Status  On-going      SLP LONG TERM GOAL #4   Title  Formal cognitive assessment and added cognitive goals as indicated    Time  6    Period  Weeks    Status  On-going       Plan - 09/04/19 1646    Clinical Impression Statement  Pt presents with atypical speech pattern most consistent with significant velo-pharyngeal incompentance (VPI) resulting in mod hypernasal speech. Speech extremely labored and flat with very notable and distracting vocal pulsating in structured tasks (home exercise program) and much less labored with somewhat more normal prosody without vocal pulsating in simple conversation. Pt endorses dificulty with med administration (remembering) so SLP collaborated with pt today for compensations for tihs to improve. I recommend skilled ST to maximize intelligilbity and  for ongoing assessment of cogntive linguistic skills as indicated.    Speech Therapy Frequency  2x / week    Duration  --   6 weeks or 13 visits   Treatment/Interventions  Language facilitation;Environmental controls;Cueing hierarchy;SLP instruction and feedback;Compensatory strategies;Functional tasks;Cognitive reorganization;Compensatory techniques;Oral motor exercises;Patient/family education;Multimodal communcation approach;Internal/external aids;Trials of upgraded texture/liquids    Potential to Achieve Goals  Good    Consulted and Agree with Plan of Care  Patient;Family member/caregiver    Family Member Consulted  mom, Darreld Mclean       Patient will benefit from skilled therapeutic intervention in order to improve the following deficits and impairments:   Dysarthria and anarthria    Problem List There are no problems to display for this patient.   [facility] ,MS, CCC-SLP  09/04/2019, 4:52 PM  [facility] [address] [place], [place], [address] Phone: [phone]   Fax:  [phone]   Name: Jordan Robinson MRN: [id number] Date of Birth: 1995-06-02

## 2019-09-07 ENCOUNTER — Encounter: Payer: Self-pay | Admitting: Family Medicine

## 2019-09-07 ENCOUNTER — Other Ambulatory Visit: Payer: Self-pay

## 2019-09-07 ENCOUNTER — Ambulatory Visit (INDEPENDENT_AMBULATORY_CARE_PROVIDER_SITE_OTHER): Payer: BC Managed Care – PPO

## 2019-09-07 ENCOUNTER — Ambulatory Visit (INDEPENDENT_AMBULATORY_CARE_PROVIDER_SITE_OTHER): Payer: BC Managed Care – PPO | Admitting: Family Medicine

## 2019-09-07 VITALS — BP 104/78 | HR 115 | Ht 67.0 in | Wt 180.4 lb

## 2019-09-07 DIAGNOSIS — R471 Dysarthria and anarthria: Secondary | ICD-10-CM

## 2019-09-07 DIAGNOSIS — S060X0A Concussion without loss of consciousness, initial encounter: Secondary | ICD-10-CM

## 2019-09-07 DIAGNOSIS — R42 Dizziness and giddiness: Secondary | ICD-10-CM | POA: Diagnosis not present

## 2019-09-07 DIAGNOSIS — R278 Other lack of coordination: Secondary | ICD-10-CM

## 2019-09-07 DIAGNOSIS — M25532 Pain in left wrist: Secondary | ICD-10-CM | POA: Diagnosis not present

## 2019-09-07 DIAGNOSIS — S6992XA Unspecified injury of left wrist, hand and finger(s), initial encounter: Secondary | ICD-10-CM | POA: Diagnosis not present
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
## 2019-09-07 MED ORDER — NORTRIPTYLINE HCL 25 MG PO CAPS
50.0000 mg | ORAL_CAPSULE | Freq: Every evening | ORAL | 2 refills | Status: DC | PRN
Start: 2019-09-07 — End: 2019-10-19

## 2019-09-07 NOTE — Patient Instructions (Addendum)
Thank you for coming in today.  Plan to increase nortriptyline to 50mg  (2 pills) at bedtime.  Let me know how this goes. I can do more sooner if needed.   I have referred to neurology as well.  I will also follow up on the occupational therapy referral and vestibular therapy.  Let me know in 1 week if this has not improved.   You should hear from neurology about scheduling in the next week or so.   Keep me updated,   Take Vit D3 over the counter 5000 units daily.  Take krill oil daily.  Ok to try CBD but less evidence for it. It may make you sleepy.   Recheck in 2 weeks.
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
## 2019-09-08 ENCOUNTER — Ambulatory Visit: Payer: BC Managed Care – PPO

## 2019-09-08 ENCOUNTER — Other Ambulatory Visit: Payer: Self-pay

## 2019-09-08 DIAGNOSIS — R471 Dysarthria and anarthria: Secondary | ICD-10-CM

## 2019-09-08 DIAGNOSIS — M6281 Muscle weakness (generalized): Secondary | ICD-10-CM | POA: Diagnosis not present

## 2019-09-08 DIAGNOSIS — R42 Dizziness and giddiness: Secondary | ICD-10-CM

## 2019-09-08 DIAGNOSIS — R2689 Other abnormalities of gait and mobility: Secondary | ICD-10-CM

## 2019-09-08 NOTE — Progress Notes (Signed)
Wrist xray looks normal.

## 2019-09-08 NOTE — Therapy (Signed)
[facility] [address] [place], [place], [address] Phone: [phone]   Fax:  [phone]  Physical Therapy Treatment  Patient Details  Name: Jordan Robinson MRN: [id number] Date of Birth: 1996/01/30 Referring Provider (PT): [name]   Encounter Date: 09/08/2019  PT End of Session - 09/08/19 0937    Visit Number  4    Number of Visits  17    Date for PT Re-Evaluation  11/22/19    PT Start Time  0935    PT Stop Time  1015    PT Time Calculation (min)  40 min    Activity Tolerance  Patient tolerated treatment well    Behavior During Therapy  [facility] for tasks assessed/performed       Past Medical History:  Diagnosis Date  . Asthma     Past Surgical History:  Procedure Laterality Date  . DENTAL SURGERY    . masectomy      There were no vitals filed for this visit.  Subjective Assessment - 09/08/19 0937    Subjective  Pt reports that he has been doing his exercises. He did get very dizzy and nauseas after doing the tandem walking the other day. Saw Dr. Denyse Amass the other day as well.    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  However the provider who was previously managing testosterone at [facility] in [place] has not been very available.  He has been off of testosterone for a month    Diagnostic tests  CT head, MRI/MRA of the brain and C-spine which were all negative.    Patient Stated Goals  Pt wants to get back to normal.    Currently in Pain?  Yes    Pain Score  3     Pain Location  Head    Pain Descriptors / Indicators  Aching    Pain Type  Acute pain    Pain Onset  1 to 4 weeks ago    Pain Frequency  Intermittent    Pain Score  4    Pain Location  Wrist    Pain Orientation  Left    Pain Type  Acute pain                        [facility] Adult PT Treatment/Exercise - 09/08/19 0941      Ambulation/Gait   Ambulation/Gait   Yes    Ambulation/Gait Assistance  5: Supervision;4: Min guard    Ambulation/Gait Assistance Details  around in clinic during session. Supervision when not performing activities but CGA at times with activities. Varied throughout session with more extraneous movements as session went on and staggering at times.    Assistive device  None    Gait Pattern  Step-through pattern;Narrow base of support    Ambulation Surface  Level;Indoor      Neuro Re-ed    Neuro Re-ed Details   Pt reports 3-4/10 dizziness upon arival after speech.  Saccadic movements with looking with eyes to PT finger then following with head to each side 30 sec x 2. Pt was cued to go take his time. As went on it became harder to initiate eye movement with eyeslips starting to droop.  In // bars: marching gait without UE support 6' x 4, side stepping 6' x 4, tandem gait 6' x 4. Pt developed increased extraneous movements as he went on and needed to rest after  these three exercises. PT discussed importance of deep breathing exercises to work on relaxation in between activities. In hallway: walking with head turns right/left on command x 40' supervision/CGA. Pt had increased stagger looking left. Gait 40' normal steps after standing rest then attempted large steps but pt reporting 6/10 dizziness and noted increased extraneous movements so had him sit. Symptoms back down to 3-4/10 after rest break. Pt's assistance level varied from supervision to min assist with activities. Pt stated brain felt "overloaded" at times. PT provided multiple seated rest break throughout session working on deep breathing in 5 sec and out 5 seconds.              PT Education - 09/08/19 1512    Education Details  Pt to continue with current HEP. Encouraged him to continue to take rest breaks working on deep breathing exercises to try to relax when starting to feeling overwhelmed.    Person(s) Educated  Patient    Methods  Explanation    Comprehension   Verbalized understanding       PT Short Term Goals - 08/24/19 2105      PT SHORT TERM GOAL #1   Title  Pt will be independent with initial HEP for strength, balance and ROM.    Time  4    Period  Weeks    Status  New    Target Date  09/23/19      PT SHORT TERM GOAL #2   Title  Pt will report headaches <4/10 with functional activities for improved function.    Baseline  3-8/10    Time  4    Period  Weeks    Status  New    Target Date  09/23/19      PT SHORT TERM GOAL #3   Title  Pt will increase gait speed from 0.57m/s to >0.67m/s for improved gait safety in community.    Baseline  0.53m/s on 08/24/19    Time  4    Period  Weeks    Status  New    Target Date  09/23/19      PT SHORT TERM GOAL #4   Title  Pt will ambulate >500' on varied surfaces independently for improved gait safety.    Time  4    Period  Weeks    Status  New    Target Date  09/23/19        PT Long Term Goals - 08/24/19 2109      PT LONG TERM GOAL #1   Title  Pt will be independent for HEP for aerobic exercise, balance and strength to continue gains on own.    Time  8    Period  Weeks    Status  New    Target Date  10/23/19      PT LONG TERM GOAL #2   Title  Pt will increase FGA from 11/30 to >24/30 for improved balance and decreased fall risk.    Baseline  11/30 on 08/24/19    Time  8    Period  Weeks    Status  New    Target Date  10/23/19      PT LONG TERM GOAL #3   Title  Pt will increase cervical ROM by 25 degrees or more in flexion/ext and rotation for improved mobility.    Baseline  extension=18 degrees, flexion=15 degrees, cervical rotation 25 degrees each direction 08/24/19    Time  8    Period  Weeks    Status  New    Target Date  10/23/19      PT LONG TERM GOAL #4   Title  Pt will report dizziness <3/10 with functional activities for improved function.    Baseline  5/10 with turning at visit    Time  8    Period  Weeks    Status  New    Target Date  10/23/19             Plan - 09/08/19 1513    Clinical Impression Statement  Pt's presentation varies greatly throughout session with more extraneous movements noted as he fatigues. Pt unable to relax arms with walking activities with increased shaking and holding out to side but with normal walking does well. Dizziness varied throughout session but calmed back down to around 3/10 by end of session which is what he came in with. PT did try to work on saccade activity today and patient started out well and started to have more trouble as went on like other activities.    Personal Factors and Comorbidities  Comorbidity 1    Comorbidities  anxiety    Examination-Activity Limitations  Locomotion Level    Examination-Participation Restrictions  Community Activity;Driving    Stability/Clinical Decision Making  Evolving/Moderate complexity    Rehab Potential  Good    PT Frequency  2x / week    PT Duration  8 weeks    PT Treatment/Interventions  ADLs/Self Care Home Management;Moist Heat;Cryotherapy;Therapeutic activities;Stair training;Functional mobility training;Gait training;Therapeutic exercise;Balance training;Patient/family education;Neuromuscular re-education;Manual techniques;Passive range of motion;Spinal Manipulations;Vestibular    PT Next Visit Plan  continue balance training and coordination activities. VOR? Continue to break up activities and work on IT trainer.    Consulted and Agree with Plan of Care  Patient       Patient will benefit from skilled therapeutic intervention in order to improve the following deficits and impairments:  Decreased activity tolerance, Abnormal gait, Decreased balance, Decreased mobility, Decreased range of motion, Decreased coordination, Dizziness, Impaired vision/preception, Pain, Impaired UE functional use  Visit Diagnosis: Dizziness and giddiness  Other abnormalities of gait and mobility  Muscle weakness (generalized)     Problem List There are  no problems to display for this patient.   Electa Sniff, PT, DPT, NCS 09/08/2019, 3:17 PM  [facility] [address] [place], [place], [address] Phone: [phone]   Fax:  [phone]  Name: Filiberto Wamble MRN: [id number] Date of Birth: 04-Jul-1995

## 2019-09-08 NOTE — Patient Instructions (Addendum)
  Complete the writing homework and bring to your next session.

## 2019-09-08 NOTE — Therapy (Signed)
[facility] [address] [place], [place], [address] Phone: [phone]   Fax:  [phone]  Speech Language Pathology Treatment  Patient Details  Name: Jordan Robinson MRN: [id number] Date of Birth: 1995-11-09 Referring Provider (SLP): Dr. Lynne Leader   Encounter Date: 09/08/2019  End of Session - 09/08/19 1730    Visit Number  3    Number of Visits  12    Date for SLP Re-Evaluation  10/05/19    SLP Start Time  0850    SLP Stop Time   0930    SLP Time Calculation (min)  40 min    Activity Tolerance  Patient tolerated treatment well       Past Medical History:  Diagnosis Date  . Asthma     Past Surgical History:  Procedure Laterality Date  . DENTAL SURGERY    . masectomy      There were no vitals filed for this visit.  Subjective Assessment - 09/08/19 0906    Subjective  Pt with MD appointment yesterday - "My parents were there." Re: SLP: "Did you think of asking Dr. 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 SLP assessed hypernasality with a mirror and pt without notable nasal emissions during conversational speech. Pt cont with pulsating stacatto voicing during the HEP but WNL voicing in SOVTE. Pt has neuro consult pending. Dad reports pt has difficulty with texting "He has to think about what to text before he texts it." Pt reported his administration of meds is more consistent due to a checklist for his AM routine.       Assessment / Recommendations / Plan   Plan  Continue  with current plan of care      Progression Toward Goals   Progression toward goals  Progressing toward goals       SLP Education - 09/08/19 1730    Education Details  cont to perform straw phonation prior to HEP for tongue twisters    Person(s) Educated  Patient;Parent(s)    Methods  Explanation    Comprehension  Verbalized understanding         SLP Long Term Goals - 09/08/19 1733      SLP LONG TERM GOAL #1   Title  Pt will complete HEP for dysarthria with rare min A over 2 sessions    Time  5    Period  Weeks    Status  On-going      SLP LONG TERM GOAL #2   Title  Pt will utilize compensations for dysarthira in structured speech tasks with rare min A over 2 sessions    Time  5    Period  Weeks    Status  On-going      SLP LONG TERM GOAL #3   Title  Pt will be 95% intelligible in noisy environment over 20 minute conversation and report 25% reduction (subjectively) for requests for repetition by family with rare min A over 2 sessions  Time  5    Period  Weeks    Status  On-going      SLP LONG TERM GOAL #4   Title  Formal cognitive assessment and added cognitive goals as indicated    Time  5    Period  Weeks    Status  On-going       Plan - 09/08/19 1730    Clinical Impression Statement  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 Speech extremely labored and flat with very notable and distracting vocal pulsating in structured tasks (home exercise program) and much less labored with somewhat more normal prosody without vocal pulsating in simple conversation. Pt endorses better med administration due to checklist for AM routine. I recommend cont'd skilled ST to maximize intelligilbity and for ongoing assessment of cogntive linguistic skills as indicated.    Speech Therapy Frequency  2x / week    Duration  --   6 weeks or 13 visits   Treatment/Interventions  Language facilitation;Environmental controls;Cueing hierarchy;SLP  instruction and feedback;Compensatory strategies;Functional tasks;Cognitive reorganization;Compensatory techniques;Oral motor exercises;Patient/family education;Multimodal communcation approach;Internal/external aids;Trials of upgraded texture/liquids    Potential to Achieve Goals  Good    Consulted and Agree with Plan of Care  Patient;Family member/caregiver    Family Member Consulted  mom, Darreld Mclean       Patient will benefit from skilled therapeutic intervention in order to improve the following deficits and impairments:   Dysarthria and anarthria    Problem List There are no problems to display for this patient.   [facility] ,MS, CCC-SLP  09/08/2019, 5:33 PM  [facility] [address] [place], [place], [address] Phone: [phone]   Fax:  [phone]   Name: Jordan Robinson MRN: [id number] Date of Birth: 1996/03/12

## 2019-09-10 ENCOUNTER — Other Ambulatory Visit: Payer: Self-pay

## 2019-09-10 ENCOUNTER — Ambulatory Visit: Payer: BC Managed Care – PPO

## 2019-09-10 ENCOUNTER — Encounter: Payer: Self-pay | Admitting: Family Medicine

## 2019-09-10 DIAGNOSIS — R42 Dizziness and giddiness: Secondary | ICD-10-CM | POA: Diagnosis not present

## 2019-09-10 DIAGNOSIS — R471 Dysarthria and anarthria: Secondary | ICD-10-CM

## 2019-09-10 DIAGNOSIS — R2689 Other abnormalities of gait and mobility: Secondary | ICD-10-CM

## 2019-09-10 DIAGNOSIS — M6281 Muscle weakness (generalized): Secondary | ICD-10-CM | POA: Diagnosis not present

## 2019-09-10 NOTE — Patient Instructions (Addendum)
     Ways to compensate when you can't think of a word:  - think of a synonym for the word  - describe the thing (works for a person, object, or a place)  - say it a completely different way (think of a different sentence)    =============================================   Make a chart of when you cough and what you are coughing on

## 2019-09-10 NOTE — Patient Instructions (Signed)
Access Code: [facility] URL: [email or web address] Date: 09/10/2019 Prepared by: Jethro Bastos  Exercises Supine Lower Trunk Rotation - 1 x daily - 7 x weekly - 2 sets - 10 reps Hooklying Single Knee to Chest Stretch - 2 x daily - 7 x weekly - 1 sets - 3 reps - 30 sec hold Standing in corner eyes open and eyes closed - 2 x daily - 7 x weekly - 1 sets - 3 reps - 20-30 sec hold Seated March - 2 x daily - 7 x weekly - 2 sets - 10 reps Standing Marching - 2 x daily - 7 x weekly - 2 sets - 10 reps Seated Gaze Stabilization with Head Rotation - 1 x daily - 7 x weekly - 2 sets - 10 reps

## 2019-09-10 NOTE — Therapy (Signed)
[facility] [address] [place], [place], [address] Phone: [phone]   Fax:  [phone]  Physical Therapy Treatment  Patient Details  Name: Jordan Robinson MRN: [id number] Date of Birth: [birth year] Referring Provider (PT): Lynne Leader   Encounter Date: 09/10/2019   PT End of Session - 09/10/19 1026    Visit Number 5    Number of Visits 17    Date for PT Re-Evaluation 11/22/19    PT Start Time 9678   finishing up with Speech Therapy session   PT Stop Time 1100    PT Time Calculation (min) 37 min    Equipment Utilized During Treatment Gait belt    Activity Tolerance Patient tolerated treatment well    Behavior During Therapy [facility] for tasks assessed/performed           Past Medical History:  Diagnosis Date  . Asthma     Past Surgical History:  Procedure Laterality Date  . DENTAL SURGERY    . masectomy      There were no vitals filed for this visit.   Subjective Assessment - 09/10/19 1023    Subjective Patient reports mild dizziness today, reports still some mild nausea but is getting used to it. 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  However the provider who was previously managing testosterone at [facility] in [place] has not been very available.  He has been off of testosterone for a month    Diagnostic tests CT head, MRI/MRA of the brain and C-spine which were all negative.    Patient Stated Goals Pt wants to get back to normal.    Currently in Pain? Yes    Pain Score 4     Pain Location Head    Pain Orientation Right;Left;Posterior    Pain Descriptors / Indicators Aching    Pain Type Acute pain    Pain Onset 1 to 4 weeks ago    Pain Score 6    Pain Location Wrist    Pain Orientation Left    Pain Type Acute pain                             [facility] Adult PT Treatment/Exercise -  09/10/19 0001      Ambulation/Gait   Ambulation/Gait Yes    Ambulation/Gait Assistance 5: Supervision;4: Min guard    Ambulation/Gait Assistance Details around in clinic during session, no extranous movements noted with gait around therapy gym. continue to only demonstrate with balance actvities.     Assistive device None    Gait Pattern Step-through pattern;Narrow base of support    Ambulation Surface Level;Indoor      High Level Balance   High Level Balance Activities Tandem walking;Marching forwards    High Level Balance Comments Standing along countertop, completed forward tandem walking x 4 laps. Rest break reqiured after completion. Completed forward marching, with pt demo difficulty with coordination and extraneous movement toward the end of completion. Reports nausea increased to 6/10 after completion.       Neuro Re-ed    Neuro Re-ed Details  Standing balance activties: completed alternating toe taps standing on pillows to cones, completed forward toe taps 2 x 10 reps, and then crossover top taps 2 x 10 reps. Overall patient demo good stability but did report faitgue and nausea requiring rest break.  Vestibular Treatment/Exercise - 09/10/19 0001      X1 Viewing Horizontal   Foot Position seated edge of mat, standing feet apart    Reps 2    Comments x 45 secs each. Completed both seated and standing. Pt reports increased difficulty with seated.      X1 Viewing Vertical   Foot Position seated edge of mat, standing feet apart    Reps 2    Comments x 45 secs each. Completed both seated and standing. Pt reports increased difficulty with seated. Patient reports vertical increases nausea. Require intermittent rest breaks. No abnormal sway noted in standing, but difficulty with concentrating on letter.               Balance Exercises - 09/10/19 0001      Balance Exercises: Standing   Tandem Stance Eyes open;3 reps;30 secs;Limitations    SLS Eyes open;Solid  surface;3 reps;30 secs;Limitations    SLS Limitations completed alternating lower extremity, increased difficulty with R>L. As patient continues completion demonstrate difficulty with maintaing, CGA required for steadying.    Rockerboard Anterior/posterior;EO;EC;Intermittent UE support    Other Standing Exercises Comments with tandem stance on firm surface, patient demonstrate increased difficulty with RLE positioned posteriorly. Initially started with single light UE support from counter, progressed to completion on no UE support. In // bars: completed standing on rockerboard with focus on holding steady 3 x 30 secs w/o UE support with eyes open. Progressed to eyes closed with single UE support from bars. Increased sway and difficulty noted with eyes closed.               PT Short Term Goals - 08/24/19 2105      PT SHORT TERM GOAL #1   Title Pt will be independent with initial HEP for strength, balance and ROM.    Time 4    Period Weeks    Status New    Target Date 09/23/19      PT SHORT TERM GOAL #2   Title Pt will report headaches <4/10 with functional activities for improved function.    Baseline 3-8/10    Time 4    Period Weeks    Status New    Target Date 09/23/19      PT SHORT TERM GOAL #3   Title Pt will increase gait speed from 0.33m/s to >0.18m/s for improved gait safety in community.    Baseline 0.92m/s on 08/24/19    Time 4    Period Weeks    Status New    Target Date 09/23/19      PT SHORT TERM GOAL #4   Title Pt will ambulate >500' on varied surfaces independently for improved gait safety.    Time 4    Period Weeks    Status New    Target Date 09/23/19             PT Long Term Goals - 08/24/19 2109      PT LONG TERM GOAL #1   Title Pt will be independent for HEP for aerobic exercise, balance and strength to continue gains on own.    Time 8    Period Weeks    Status New    Target Date 10/23/19      PT LONG TERM GOAL #2   Title Pt will increase FGA  from 11/30 to >24/30 for improved balance and decreased fall risk.    Baseline 11/30 on 08/24/19    Time 8    Period  Weeks    Status New    Target Date 10/23/19      PT LONG TERM GOAL #3   Title Pt will increase cervical ROM by 25 degrees or more in flexion/ext and rotation for improved mobility.    Baseline extension=18 degrees, flexion=15 degrees, cervical rotation 25 degrees each direction 08/24/19    Time 8    Period Weeks    Status New    Target Date 10/23/19      PT LONG TERM GOAL #4   Title Pt will report dizziness <3/10 with functional activities for improved function.    Baseline 5/10 with turning at visit    Time 8    Period Weeks    Status New    Target Date 10/23/19                 Plan - 09/10/19 1406    Clinical Impression Statement Today's skilled PT session focused on continued balance and vestibular training. Throughout session patient's presentation continues to vary with certain balance activities continuing to increase nausea and dizziness. Progressed VOR exercises to standing today. Patietn reports increased difficulty with VOR in seated > standing. Updated HEP to include VOR seated. Pt will continue to benefit from skilled therapy to address deficits.    Personal Factors and Comorbidities Comorbidity 1    Comorbidities anxiety    Examination-Activity Limitations Locomotion Level    Examination-Participation Restrictions Community Activity;Driving    Stability/Clinical Decision Making Evolving/Moderate complexity    Rehab Potential Good    PT Frequency 2x / week    PT Duration 8 weeks    PT Treatment/Interventions ADLs/Self Care Home Management;Moist Heat;Cryotherapy;Therapeutic activities;Stair training;Functional mobility training;Gait training;Therapeutic exercise;Balance training;Patient/family education;Neuromuscular re-education;Manual techniques;Passive range of motion;Spinal Manipulations;Vestibular    PT Next Visit Plan How was VOR addition?  continue relaxation techniques. continue to balance trainig and coordination    Consulted and Agree with Plan of Care Patient           Patient will benefit from skilled therapeutic intervention in order to improve the following deficits and impairments:  Decreased activity tolerance, Abnormal gait, Decreased balance, Decreased mobility, Decreased range of motion, Decreased coordination, Dizziness, Impaired vision/preception, Pain, Impaired UE functional use  Visit Diagnosis: Dizziness and giddiness  Other abnormalities of gait and mobility  Muscle weakness (generalized)     Problem List There are no problems to display for this patient.   Tempie Donning, PT, DPT 09/10/2019, 2:11 PM  [facility] [address] [place], [place], [address] Phone: [phone]   Fax:  [phone]  Name: Kelvon Giannini MRN: [id number] Date of Birth: [birth year]

## 2019-09-11 ENCOUNTER — Encounter: Payer: Self-pay | Admitting: Family Medicine

## 2019-09-11 ENCOUNTER — Ambulatory Visit: Payer: BC Managed Care – PPO

## 2019-09-11 NOTE — Therapy (Signed)
[facility] [address] [place], [place], [address] Phone: [phone]   Fax:  [phone]  Speech Language Pathology Treatment  Patient Details  Name: Jordan Robinson MRN: [id number] Date of Birth: December 01, 1995 Referring Provider (SLP): Dr. [name]   Encounter Date: 09/10/2019   End of Session - 09/11/19 0008    Visit Number 4    Number of Visits 12    Date for SLP Re-Evaluation 10/05/19    SLP Start Time 0935    SLP Stop Time  1017    SLP Time Calculation (min) 42 min           Past Medical History:  Diagnosis Date  . Asthma     Past Surgical History:  Procedure Laterality Date  . DENTAL SURGERY    . masectomy      There were no vitals filed for this visit.   Subjective Assessment - 09/10/19 2335    Subjective Pt did not bring homework back.    Patient is accompained by: --   "best friend"                ADULT SLP TREATMENT - 09/11/19 0001      General Information   Behavior/Cognition Alert;Cooperative;Pleasant mood      Treatment Provided   Treatment provided Cognitive-Linquistic;Dysphagia      Dysphagia Treatment   Other treatment/comments After speech portion of today's session SLP asked pt if pt had any difficulty with swallowing in order to confirm that swallowing was WNL, as reported on date of eval - SLP attempting to point out incongruities between pt's presentation and what is normally expected in a patient with such severe speech difference. Surprisingly pt stated swallowing liquids, rice, and salad. resulted in coughing. Pt denied swallowing difficulty on date of eval.  Pt was told to keep a log of every time pt coughed with food or liquid and bring it to next session.       Cognitive-Linquistic Treatment   Treatment focused on Cognition;Voice    Skilled Treatment Pt forgot to bring written homework. SLP educated pt on compensatory strategies for anomia as pt stated this is the  difficulty pt is having with texting - reported same thing with talking and this occurs average x2/day. SLP then worked on pt's speech difference - SLP had pt work on the dysarthria HEP and noted pt was verbalizing voiced /s/ in phrases and in conversation. SLP had pt vocalize sustained /s/ and sustained /z/ and pt held each accurately for 24 and 23 seconds, respectively. Then had pt say words with inital /s/ - pt substitued /z/ for /s/ in each one until SLP told pt that pt just sustained the /s/ sound for 24 seconds and pt imitated the initial /s/ correctly. SLP repeated this sequence with pt x3 with the same results. SLP suggested to pt maybe referral to ENT would be helpful for obtaining more information about pt's vocal folds. Pt said pt would be willing to do this.      Assessment / Recommendations / Plan   Plan Continue with current plan of care   put ST on hold until after psych consult?     Progression Toward Goals   Progression toward goals Not progressing toward goals (comment)            SLP Education - 09/11/19 0008    Education Details pt continues to present as atypical dysarthric patient, benefits of keeping a cough log, compensations for  anomia    Person(s) Educated Patient;Caregiver(s)    Methods Explanation    Comprehension Verbalized understanding              SLP Long Term Goals - 09/11/19 0014      SLP LONG TERM GOAL #1   Title Pt will complete HEP for dysarthria with rare min A over 2 sessions    Time 5    Period Weeks    Status On-going      SLP LONG TERM GOAL #2   Title Pt will utilize compensations for dysarthira in structured speech tasks with rare min A over 2 sessions    Time 5    Period Weeks    Status On-going      SLP LONG TERM GOAL #3   Title Pt will be 95% intelligible in noisy environment over 20 minute conversation and report 25% reduction (subjectively) for requests for repetition by family with rare min A over 2 sessions    Time 5     Period Weeks    Status On-going      SLP LONG TERM GOAL #4   Title Formal cognitive assessment and added cognitive goals as indicated    Time 5    Period Weeks    Status On-going            Plan - 09/11/19 0009    Clinical Impression Statement Pt did not bring homework back to session. 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 Strange also that pt would report coughing with water, rice, and salad when pt denied overt s/sx swallowing difficulty on date of eval. Consideration if pt would benefit from a psych eval may be warranted. Speech extremely labored and flat with very distracting articulation in simple conversation. Compensations for anomia were provided today, with a handout to support this information I recommend cont'd skilled ST to maximize intelligilbity and for ongoing assessment of cogntive linguistic skills as indicated.    Speech Therapy Frequency 2x / week    Duration --   6 weeks or 13 visits   Treatment/Interventions Language facilitation;Environmental controls;Cueing hierarchy;SLP instruction and feedback;Compensatory strategies;Functional tasks;Cognitive reorganization;Compensatory techniques;Oral motor exercises;Patient/family education;Multimodal communcation approach;Internal/external aids;Trials of upgraded texture/liquids    Potential to Achieve Goals Good    Consulted and Agree with Plan of Care Patient;Family member/caregiver    Family Member Consulted mom, Lisabeth Pick           Patient will benefit from skilled therapeutic intervention in order to improve the following deficits and impairments:   Dysarthria and anarthria    Problem List There are no problems to display for this patient.   [facility] ,[place], [place]  09/11/2019, 12:14 AM  [facility] [address] [place], [place], [address] Phone: [phone]   Fax:  [phone]   Name: Jordan Robinson MRN: [id number] Date of Birth: [birth year]

## 2019-09-15 ENCOUNTER — Encounter: Payer: Self-pay | Admitting: Diagnostic Neuroimaging

## 2019-09-15 ENCOUNTER — Ambulatory Visit (INDEPENDENT_AMBULATORY_CARE_PROVIDER_SITE_OTHER): Payer: BC Managed Care – PPO | Admitting: Diagnostic Neuroimaging

## 2019-09-15 VITALS — BP 112/78 | HR 123 | Ht 67.0 in | Wt 181.8 lb

## 2019-09-15 DIAGNOSIS — F0781 Postconcussional syndrome: Secondary | ICD-10-CM | POA: Diagnosis not present
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
## 2019-09-16 ENCOUNTER — Ambulatory Visit: Payer: BC Managed Care – PPO

## 2019-09-16 ENCOUNTER — Ambulatory Visit: Payer: BC Managed Care – PPO | Admitting: Speech Pathology

## 2019-09-16 ENCOUNTER — Other Ambulatory Visit: Payer: Self-pay

## 2019-09-16 ENCOUNTER — Encounter: Payer: Self-pay | Admitting: Speech Pathology

## 2019-09-16 DIAGNOSIS — R42 Dizziness and giddiness: Secondary | ICD-10-CM

## 2019-09-16 DIAGNOSIS — M6281 Muscle weakness (generalized): Secondary | ICD-10-CM

## 2019-09-16 DIAGNOSIS — R471 Dysarthria and anarthria: Secondary | ICD-10-CM

## 2019-09-16 DIAGNOSIS — R2689 Other abnormalities of gait and mobility: Secondary | ICD-10-CM
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
## 2019-09-16 NOTE — Patient Instructions (Signed)
   Think about increasing light exposure time a little every few days, not to where it is debilitating, but building endurance   Same with daily activities - try to push a little (few minutes) to build endurance  Take a break in a dark quiet room in between tasks/activities to allow your brain to reset for a few minutes   Read aloud with exaggerated emphasis - focus on changing your inflection  Our speech sounds and phonics have pairs - sounds produced the same way with your mouth, only 1 sound is voiced and one is voiceless. The pairs are:  P/b S/z T/d F/v K/g Th/th Sh/sh  When you are reading, think about which sounds have voice on and voice off - underline the voice off sounds and practice turning your voice off  The way you are taking is called continuous phonation, where your voice stays on so time sounds like dime or pat sounds like bat or kill sounds like gill or bat sounds like bad
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
## 2019-09-21 ENCOUNTER — Other Ambulatory Visit: Payer: Self-pay

## 2019-09-21 ENCOUNTER — Ambulatory Visit (INDEPENDENT_AMBULATORY_CARE_PROVIDER_SITE_OTHER): Payer: BC Managed Care – PPO | Admitting: Family Medicine

## 2019-09-21 ENCOUNTER — Ambulatory Visit: Payer: BC Managed Care – PPO

## 2019-09-21 ENCOUNTER — Encounter: Payer: Self-pay | Admitting: Family Medicine

## 2019-09-21 VITALS — BP 108/78 | HR 127 | Ht 67.0 in | Wt 182.4 lb

## 2019-09-21 DIAGNOSIS — S060X0D Concussion without loss of consciousness, subsequent encounter: Secondary | ICD-10-CM

## 2019-09-21 DIAGNOSIS — F419 Anxiety disorder, unspecified: Secondary | ICD-10-CM

## 2019-09-21 DIAGNOSIS — S060X0A Concussion without loss of consciousness, initial encounter: Secondary | ICD-10-CM | POA: Insufficient documentation

## 2019-09-21 DIAGNOSIS — F32A Depression, unspecified: Secondary | ICD-10-CM | POA: Insufficient documentation

## 2019-09-21 DIAGNOSIS — R519 Headache, unspecified: Secondary | ICD-10-CM | POA: Diagnosis not present

## 2019-09-21 DIAGNOSIS — F909 Attention-deficit hyperactivity disorder, unspecified type: Secondary | ICD-10-CM | POA: Diagnosis not present

## 2019-09-21 DIAGNOSIS — F329 Major depressive disorder, single episode, unspecified: Secondary | ICD-10-CM

## 2019-09-21 MED ORDER — ONDANSETRON 8 MG PO TBDP
8.0000 mg | ORAL_TABLET | Freq: Three times a day (TID) | ORAL | 3 refills | Status: DC | PRN
Start: 2019-09-21 — End: 2020-01-18

## 2019-09-21 MED ORDER — TOPIRAMATE 25 MG PO TABS
25.0000 mg | ORAL_TABLET | Freq: Two times a day (BID) | ORAL | 1 refills | Status: DC
Start: 2019-09-21 — End: 2019-10-19

## 2019-09-21 NOTE — Patient Instructions (Addendum)
Thank you for coming in today. OK to try over the counter meclizine for dizzy or nausea.  OK to use zofran as needed for nausea or vomiting Continue nortriptyline Add Topamax twice daily for headache prevention.   Recheck in 1 month.  Return or contact me sooner if we need to change something.   Physical and Speech Therapy and time will be most helpful.   Topiramate tablets What is this medicine? TOPIRAMATE (toe PYRE a mate) is used to treat seizures in adults or children with epilepsy. It is also used for the prevention of migraine headaches. This medicine may be used for other purposes; ask your health care provider or pharmacist if you have questions. COMMON BRAND NAME(S): Topamax, Topiragen What should I tell my health care provider before I take this medicine? They need to know if you have any of these conditions:  bleeding disorders  kidney disease  lung or breathing disease, like asthma  suicidal thoughts, plans, or attempt; a previous suicide attempt by you or a family member  an unusual or allergic reaction to topiramate, other medicines, foods, dyes, or preservatives  pregnant or trying to get pregnant  breast-feeding How should I use this medicine? Take this medicine by mouth with a glass of water. Follow the directions on the prescription label. Do not cut, crush or chew this medicine. Swallow the tablets whole. You can take it with or without food. If it upsets your stomach, take it with food. Take your medicine at regular intervals. Do not take it more often than directed. Do not stop taking except on your doctor's advice. A special MedGuide will be given to you by the pharmacist with each prescription and refill. Be sure to read this information carefully each time. Talk to your pediatrician regarding the use of this medicine in children. While this drug may be prescribed for children as young as 65 years of age for selected conditions, precautions do  apply. Overdosage: If you think you have taken too much of this medicine contact a poison control center or emergency room at once. NOTE: This medicine is only for you. Do not share this medicine with others. What if I miss a dose? If you miss a dose, take it as soon as you can. If your next dose is to be taken in less than 6 hours, then do not take the missed dose. Take the next dose at your regular time. Do not take double or extra doses. What may interact with this medicine? This medicine may interact with the following medications:  acetazolamide  alcohol  antihistamines for allergy, cough, and cold  aspirin and aspirin-like medicines  atropine  birth control pills  certain medicines for anxiety or sleep  certain medicines for bladder problems like oxybutynin, tolterodine  certain medicines for depression like amitriptyline, fluoxetine, sertraline  certain medicines for seizures like carbamazepine, phenobarbital, phenytoin, primidone, valproic acid, zonisamide  certain medicines for stomach problems like dicyclomine, hyoscyamine  certain medicines for travel sickness like scopolamine  certain medicines for Parkinson's disease like benztropine, trihexyphenidyl  certain medicines that treat or prevent blood clots like warfarin, enoxaparin, dalteparin, apixaban, dabigatran, and rivaroxaban  digoxin  general anesthetics like halothane, isoflurane, methoxyflurane, propofol  hydrochlorothiazide  ipratropium  lithium  medicines that relax muscles for surgery  metformin  narcotic medicines for pain  NSAIDs, medicines for pain and inflammation, like ibuprofen or naproxen  phenothiazines like chlorpromazine, mesoridazine, prochlorperazine, thioridazine  pioglitazone This list may not describe all possible interactions. Give  your health care provider a list of all the medicines, herbs, non-prescription drugs, or dietary supplements you use. Also tell them if you  smoke, drink alcohol, or use illegal drugs. Some items may interact with your medicine. What should I watch for while using this medicine? Visit your doctor or health care professional for regular checks on your progress. Tell your health care professional if your symptoms do not start to get better or if they get worse. Do not stop taking except on your health care professional's advice. You may develop a severe reaction. Your health care professional will tell you how much medicine to take. Wear a medical ID bracelet or chain. Carry a card that describes your disease and details of your medicine and dosage times. This medicine can reduce the response of your body to heat or cold. Dress warm in cold weather and stay hydrated in hot weather. If possible, avoid extreme temperatures like saunas, hot tubs, very hot or cold showers, or activities that can cause dehydration such as vigorous exercise. Check with your health care professional if you have severe diarrhea, nausea, and vomiting, or if you sweat a lot. The loss of too much body fluid may make it dangerous for you to take this medicine. You may get drowsy or dizzy. Do not drive, use machinery, or do anything that needs mental alertness until you know how this medicine affects you. Do not stand up or sit up quickly, especially if you are an older patient. This reduces the risk of dizzy or fainting spells. Alcohol may interfere with the effect of this medicine. Avoid alcoholic drinks. Tell your health care professional right away if you have any change in your eyesight. Patients and their families should watch out for new or worsening depression or thoughts of suicide. Also watch out for sudden changes in feelings such as feeling anxious, agitated, panicky, irritable, hostile, aggressive, impulsive, severely restless, overly excited and hyperactive, or not being able to sleep. If this happens, especially at the beginning of treatment or after a change in  dose, call your healthcare professional. This medicine may cause serious skin reactions. They can happen weeks to months after starting the medicine. Contact your health care provider right away if you notice fevers or flu-like symptoms with a rash. The rash may be red or purple and then turn into blisters or peeling of the skin. Or, you might notice a red rash with swelling of the face, lips or lymph nodes in your neck or under your arms. Birth control may not work properly while you are taking this medicine. Talk to your health care professional about using an extra method of birth control. Women should inform their health care professional if they wish to become pregnant or think they might be pregnant. There is a potential for serious side effects and harm to an unborn child. Talk to your health care professional for more information. What side effects may I notice from receiving this medicine? Side effects that you should report to your doctor or health care professional as soon as possible:  allergic reactions like skin rash, itching or hives, swelling of the face, lips, or tongue  blood in the urine  changes in vision  confusion  loss of memory  pain in lower back or side  pain when urinating  redness, blistering, peeling or loosening of the skin, including inside the mouth  signs and symptoms of bleeding such as bloody or black, tarry stools; red or dark brown urine;  spitting up blood or brown material that looks like coffee grounds; red spots on the skin; unusual bruising or bleeding from the eyes, gums, or nose  signs and symptoms of increased acid in the body like breathing fast; fast heartbeat; headache; confusion; unusually weak or tired; nausea, vomiting  suicidal thoughts, mood changes  trouble speaking or understanding  unusual sweating  unusually weak or tired Side effects that usually do not require medical attention (report to your doctor or health care  professional if they continue or are bothersome):  dizziness  drowsiness  fever  loss of appetite  nausea, vomiting  pain, tingling, numbness in the hands or feet  stomach pain  tiredness  upset stomach This list may not describe all possible side effects. Call your doctor for medical advice about side effects. You may report side effects to FDA at 1-800-FDA-1088. Where should I keep my medicine? Keep out of the reach of children. Store at room temperature between 15 and 30 degrees C (59 and 86 degrees F). Throw away any unused medicine after the expiration date. NOTE: This sheet is a summary. It may not cover all possible information. If you have questions about this medicine, talk to your doctor, pharmacist, or health care provider.  2020 Elsevier/Gold Standard (2018-10-16 15:07:20)
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
## 2019-09-22 ENCOUNTER — Other Ambulatory Visit: Payer: Self-pay

## 2019-09-22 ENCOUNTER — Encounter: Payer: Self-pay | Admitting: Speech Pathology

## 2019-09-22 ENCOUNTER — Ambulatory Visit: Payer: BC Managed Care – PPO

## 2019-09-22 ENCOUNTER — Ambulatory Visit: Payer: BC Managed Care – PPO | Admitting: Speech Pathology

## 2019-09-22 DIAGNOSIS — M6281 Muscle weakness (generalized): Secondary | ICD-10-CM | POA: Diagnosis not present

## 2019-09-22 DIAGNOSIS — R2689 Other abnormalities of gait and mobility: Secondary | ICD-10-CM | POA: Diagnosis not present

## 2019-09-22 DIAGNOSIS — R42 Dizziness and giddiness: Secondary | ICD-10-CM

## 2019-09-22 DIAGNOSIS — R471 Dysarthria and anarthria: Secondary | ICD-10-CM | POA: Diagnosis not present

## 2019-09-22 NOTE — Patient Instructions (Signed)
Access Code: [facility] URL: [email or web address] Date: 09/22/2019 Prepared by: Jethro Bastos  Exercises Supine Lower Trunk Rotation - 1 x daily - 7 x weekly - 2 sets - 10 reps Hooklying Single Knee to Chest Stretch - 2 x daily - 7 x weekly - 1 sets - 3 reps - 30 sec hold Standing Marching - 2 x daily - 7 x weekly - 2 sets - 10 reps Tandem Walking - 1 x daily - 7 x weekly - 3 sets - 10 reps Walking with Head Rotation - 1 x daily - 7 x weekly - 3 sets - 10 reps Romberg Stance Eyes Closed on Foam Pad - 1 x daily - 7 x weekly - 1 sets - 3 reps - 5-30 secondscs hold Romberg Stance on Foam Pad with Head Rotation - 1 x daily - 7 x weekly - 2 sets - 10 reps Tandem Stance - 1 x daily - 7 x weekly - 1 sets - 3 reps - 15-30 seconds hold Seated Gaze Stabilization with Head Rotation and Horizontal Arm Movement - 1 x daily - 7 x weekly - 3 sets - 10 reps
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
## 2019-09-22 NOTE — Patient Instructions (Signed)
   When you go to a restaurant, look at the menu before hand, order when you sit down and ask for the bill early to limit your sensory exposure  Same with stores, have in mind what you need, and and mental plan for getting it. Don't just walk the aisle.  Add song to your straw exercises: [employer], You are my Sunshine etc - any song that has decent pitch change  Say 10 sentences with loud high pitch (like you are calling to a neighbor over the fence)  Say 10 sentences in a loud low, authoritative voice   These should be higher and lower than your normal pitch  Keep reading with exaggerated expression  Watch your voice on voice off sounds   Keep increasing exposure to sounds and light

## 2019-09-23 ENCOUNTER — Ambulatory Visit: Payer: BC Managed Care – PPO

## 2019-09-23 ENCOUNTER — Encounter: Payer: BC Managed Care – PPO | Admitting: Speech Pathology

## 2019-09-24 ENCOUNTER — Encounter: Payer: Self-pay | Admitting: Speech Pathology

## 2019-09-24 ENCOUNTER — Other Ambulatory Visit: Payer: Self-pay

## 2019-09-24 ENCOUNTER — Ambulatory Visit: Payer: BC Managed Care – PPO | Admitting: Speech Pathology

## 2019-09-24 ENCOUNTER — Ambulatory Visit: Payer: BC Managed Care – PPO

## 2019-09-24 DIAGNOSIS — R2689 Other abnormalities of gait and mobility: Secondary | ICD-10-CM | POA: Diagnosis not present

## 2019-09-24 DIAGNOSIS — R42 Dizziness and giddiness: Secondary | ICD-10-CM

## 2019-09-24 DIAGNOSIS — M6281 Muscle weakness (generalized): Secondary | ICD-10-CM | POA: Diagnosis not present

## 2019-09-24 DIAGNOSIS — R471 Dysarthria and anarthria: Secondary | ICD-10-CM

## 2019-09-24 NOTE — Patient Instructions (Signed)
    When practicing speech exercises at home, make each sound distinct - over ennunciate and think staccato   Break your words up, voice off in between words - make each sound sharp
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
## 2019-09-30 ENCOUNTER — Other Ambulatory Visit: Payer: Self-pay

## 2019-09-30 ENCOUNTER — Encounter: Payer: Self-pay | Admitting: Speech Pathology

## 2019-09-30 ENCOUNTER — Ambulatory Visit: Payer: BC Managed Care – PPO

## 2019-09-30 ENCOUNTER — Ambulatory Visit: Payer: BC Managed Care – PPO | Admitting: Speech Pathology

## 2019-09-30 DIAGNOSIS — R42 Dizziness and giddiness: Secondary | ICD-10-CM

## 2019-09-30 DIAGNOSIS — R2689 Other abnormalities of gait and mobility: Secondary | ICD-10-CM | POA: Diagnosis not present

## 2019-09-30 DIAGNOSIS — R471 Dysarthria and anarthria: Secondary | ICD-10-CM | POA: Diagnosis not present

## 2019-09-30 DIAGNOSIS — M6281 Muscle weakness (generalized): Secondary | ICD-10-CM | POA: Diagnosis not present
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
## 2019-09-30 NOTE — Patient Instructions (Addendum)
   Build up tolerance to [employer] and [employer]  Go in to the store and just stand there for 5- 10 minutes and deal with it a couple of times  Increase your time to 15 of just standing there and taking it in  After you can to 15-20 minutes, go in and buy 1-2 things - work up to 3-4 things  Great job increasing your exposure to sunlight  Keep up focusing on speaking with not prolonging your voice, but turning it off in between words and working on natural   This week, focus on exaggerated intonation like in childrens' book (not how you would really talk)
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
## 2019-10-02 ENCOUNTER — Other Ambulatory Visit: Payer: Self-pay

## 2019-10-02 ENCOUNTER — Ambulatory Visit: Payer: BC Managed Care – PPO | Attending: Family Medicine

## 2019-10-02 ENCOUNTER — Ambulatory Visit: Payer: BC Managed Care – PPO

## 2019-10-02 DIAGNOSIS — M6281 Muscle weakness (generalized): Secondary | ICD-10-CM | POA: Diagnosis not present

## 2019-10-02 DIAGNOSIS — R42 Dizziness and giddiness: Secondary | ICD-10-CM | POA: Diagnosis not present

## 2019-10-02 DIAGNOSIS — R41840 Attention and concentration deficit: Secondary | ICD-10-CM | POA: Insufficient documentation

## 2019-10-02 DIAGNOSIS — R471 Dysarthria and anarthria: Secondary | ICD-10-CM | POA: Diagnosis not present

## 2019-10-02 DIAGNOSIS — R41842 Visuospatial deficit: Secondary | ICD-10-CM | POA: Insufficient documentation

## 2019-10-02 DIAGNOSIS — R2681 Unsteadiness on feet: Secondary | ICD-10-CM | POA: Insufficient documentation

## 2019-10-02 DIAGNOSIS — M25612 Stiffness of left shoulder, not elsewhere classified: Secondary | ICD-10-CM | POA: Diagnosis not present

## 2019-10-02 DIAGNOSIS — R2689 Other abnormalities of gait and mobility: Secondary | ICD-10-CM | POA: Diagnosis not present

## 2019-10-02 DIAGNOSIS — M25512 Pain in left shoulder: Secondary | ICD-10-CM | POA: Diagnosis not present
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
## 2019-10-06 ENCOUNTER — Ambulatory Visit: Payer: BC Managed Care – PPO

## 2019-10-07 ENCOUNTER — Ambulatory Visit: Payer: BC Managed Care – PPO

## 2019-10-07 ENCOUNTER — Encounter: Payer: BC Managed Care – PPO | Admitting: Speech Pathology

## 2019-10-08 ENCOUNTER — Ambulatory Visit: Payer: BC Managed Care – PPO

## 2019-10-12 ENCOUNTER — Ambulatory Visit: Payer: BC Managed Care – PPO

## 2019-10-12 ENCOUNTER — Ambulatory Visit: Payer: BC Managed Care – PPO | Admitting: Occupational Therapy

## 2019-10-12 ENCOUNTER — Other Ambulatory Visit: Payer: Self-pay

## 2019-10-12 ENCOUNTER — Encounter: Payer: Self-pay | Admitting: Speech Pathology

## 2019-10-12 ENCOUNTER — Encounter: Payer: Self-pay | Admitting: Occupational Therapy

## 2019-10-12 ENCOUNTER — Ambulatory Visit: Payer: BC Managed Care – PPO | Admitting: Speech Pathology

## 2019-10-12 DIAGNOSIS — R471 Dysarthria and anarthria: Secondary | ICD-10-CM

## 2019-10-12 DIAGNOSIS — R41842 Visuospatial deficit: Secondary | ICD-10-CM | POA: Diagnosis not present

## 2019-10-12 DIAGNOSIS — R2681 Unsteadiness on feet: Secondary | ICD-10-CM | POA: Diagnosis not present

## 2019-10-12 DIAGNOSIS — R42 Dizziness and giddiness: Secondary | ICD-10-CM

## 2019-10-12 DIAGNOSIS — M25612 Stiffness of left shoulder, not elsewhere classified: Secondary | ICD-10-CM

## 2019-10-12 DIAGNOSIS — R41840 Attention and concentration deficit: Secondary | ICD-10-CM | POA: Diagnosis not present

## 2019-10-12 DIAGNOSIS — M6281 Muscle weakness (generalized): Secondary | ICD-10-CM

## 2019-10-12 DIAGNOSIS — M25512 Pain in left shoulder: Secondary | ICD-10-CM | POA: Diagnosis not present

## 2019-10-12 DIAGNOSIS — R2689 Other abnormalities of gait and mobility: Secondary | ICD-10-CM | POA: Diagnosis not present

## 2019-10-12 NOTE — Addendum Note (Signed)
Addended by: Willa Frater D on: 10/12/2019 07:09 PM   Modules accepted: Orders
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
## 2019-10-12 NOTE — Patient Instructions (Signed)
   Feel free to use earplugs as needed, but try to work up to longer periods  When you do go back to work, it is part time  After work, you will need to rest and not try to do too much initially. You will eventually build up endurance, but listen to your body  If an exercise is bothering your voice, stop doing it   Limit speech exercises to 1x a day 5 days a week, only do what you think is helping you.  Great job trying restaurants - remember, try to have everyone look at the menu and order right when you sit down and get the bill after the food is delivered to limit the time you spend  Gradually increase time in grocery store

## 2019-10-14 ENCOUNTER — Ambulatory Visit: Payer: BC Managed Care – PPO

## 2019-10-14 ENCOUNTER — Ambulatory Visit: Payer: BC Managed Care – PPO | Admitting: Speech Pathology

## 2019-10-14 ENCOUNTER — Other Ambulatory Visit: Payer: Self-pay

## 2019-10-14 DIAGNOSIS — R2681 Unsteadiness on feet: Secondary | ICD-10-CM | POA: Diagnosis not present

## 2019-10-14 DIAGNOSIS — R41842 Visuospatial deficit: Secondary | ICD-10-CM | POA: Diagnosis not present

## 2019-10-14 DIAGNOSIS — M25612 Stiffness of left shoulder, not elsewhere classified: Secondary | ICD-10-CM | POA: Diagnosis not present

## 2019-10-14 DIAGNOSIS — R42 Dizziness and giddiness: Secondary | ICD-10-CM

## 2019-10-14 DIAGNOSIS — R41840 Attention and concentration deficit: Secondary | ICD-10-CM | POA: Diagnosis not present

## 2019-10-14 DIAGNOSIS — R471 Dysarthria and anarthria: Secondary | ICD-10-CM | POA: Diagnosis not present

## 2019-10-14 DIAGNOSIS — M25512 Pain in left shoulder: Secondary | ICD-10-CM | POA: Diagnosis not present

## 2019-10-14 DIAGNOSIS — R2689 Other abnormalities of gait and mobility: Secondary | ICD-10-CM | POA: Diagnosis not present

## 2019-10-14 DIAGNOSIS — M6281 Muscle weakness (generalized): Secondary | ICD-10-CM | POA: Diagnosis not present
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
## 2019-10-15 ENCOUNTER — Encounter: Payer: Self-pay | Admitting: Occupational Therapy

## 2019-10-15 ENCOUNTER — Ambulatory Visit: Payer: BC Managed Care – PPO | Admitting: Occupational Therapy

## 2019-10-15 DIAGNOSIS — R41840 Attention and concentration deficit: Secondary | ICD-10-CM | POA: Diagnosis not present

## 2019-10-15 DIAGNOSIS — M25512 Pain in left shoulder: Secondary | ICD-10-CM | POA: Diagnosis not present

## 2019-10-15 DIAGNOSIS — R42 Dizziness and giddiness: Secondary | ICD-10-CM | POA: Diagnosis not present

## 2019-10-15 DIAGNOSIS — R41842 Visuospatial deficit: Secondary | ICD-10-CM | POA: Diagnosis not present

## 2019-10-15 DIAGNOSIS — M6281 Muscle weakness (generalized): Secondary | ICD-10-CM | POA: Diagnosis not present

## 2019-10-15 DIAGNOSIS — M25612 Stiffness of left shoulder, not elsewhere classified: Secondary | ICD-10-CM

## 2019-10-15 DIAGNOSIS — R471 Dysarthria and anarthria: Secondary | ICD-10-CM | POA: Diagnosis not present

## 2019-10-15 DIAGNOSIS — R2689 Other abnormalities of gait and mobility: Secondary | ICD-10-CM | POA: Diagnosis not present

## 2019-10-15 DIAGNOSIS — R2681 Unsteadiness on feet: Secondary | ICD-10-CM | POA: Diagnosis not present

## 2019-10-15 NOTE — Patient Instructions (Addendum)
Laying on your back  hold a ball  Or shoe box between your hands , raise arms up to just above shoulder height without elevating shoulder or arching back,then  lower ball back to your lap. Keep elbows straight.   Perform 2 sets of 10 reps 1x day Laying on your back, hold a ball or shoe box, in between both hands, straighten elbows, then bend elbows and bring ball to your chest     Perform 2 sets of 10 reps 1x day
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
## 2019-10-19 ENCOUNTER — Encounter: Payer: Self-pay | Admitting: Family Medicine

## 2019-10-19 ENCOUNTER — Other Ambulatory Visit: Payer: Self-pay

## 2019-10-19 ENCOUNTER — Ambulatory Visit: Payer: BC Managed Care – PPO

## 2019-10-19 ENCOUNTER — Encounter: Payer: BC Managed Care – PPO | Admitting: Speech Pathology

## 2019-10-19 ENCOUNTER — Ambulatory Visit (INDEPENDENT_AMBULATORY_CARE_PROVIDER_SITE_OTHER): Payer: BC Managed Care – PPO | Admitting: Family Medicine

## 2019-10-19 VITALS — BP 104/74 | HR 108 | Ht 67.0 in | Wt 185.4 lb

## 2019-10-19 DIAGNOSIS — M6281 Muscle weakness (generalized): Secondary | ICD-10-CM

## 2019-10-19 DIAGNOSIS — M25512 Pain in left shoulder: Secondary | ICD-10-CM | POA: Diagnosis not present

## 2019-10-19 DIAGNOSIS — S060X0D Concussion without loss of consciousness, subsequent encounter: Secondary | ICD-10-CM | POA: Diagnosis not present

## 2019-10-19 DIAGNOSIS — R2681 Unsteadiness on feet: Secondary | ICD-10-CM | POA: Diagnosis not present

## 2019-10-19 DIAGNOSIS — F909 Attention-deficit hyperactivity disorder, unspecified type: Secondary | ICD-10-CM | POA: Diagnosis not present

## 2019-10-19 DIAGNOSIS — R519 Headache, unspecified: Secondary | ICD-10-CM

## 2019-10-19 DIAGNOSIS — R2689 Other abnormalities of gait and mobility: Secondary | ICD-10-CM

## 2019-10-19 DIAGNOSIS — R42 Dizziness and giddiness: Secondary | ICD-10-CM

## 2019-10-19 DIAGNOSIS — M25612 Stiffness of left shoulder, not elsewhere classified: Secondary | ICD-10-CM | POA: Diagnosis not present

## 2019-10-19 DIAGNOSIS — R471 Dysarthria and anarthria: Secondary | ICD-10-CM | POA: Diagnosis not present

## 2019-10-19 DIAGNOSIS — R635 Abnormal weight gain: Secondary | ICD-10-CM | POA: Diagnosis not present

## 2019-10-19 DIAGNOSIS — R41840 Attention and concentration deficit: Secondary | ICD-10-CM | POA: Diagnosis not present

## 2019-10-19 DIAGNOSIS — R41842 Visuospatial deficit: Secondary | ICD-10-CM | POA: Diagnosis not present

## 2019-10-19 MED ORDER — TOPIRAMATE 50 MG PO TABS
50.0000 mg | ORAL_TABLET | Freq: Two times a day (BID) | ORAL | 2 refills | Status: DC
Start: 2019-10-19 — End: 2019-12-03

## 2019-10-19 MED ORDER — TRAZODONE HCL 50 MG PO TABS
50.0000 mg | ORAL_TABLET | Freq: Every evening | ORAL | 1 refills | Status: DC | PRN
Start: 2019-10-19 — End: 2020-01-11
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
## 2019-10-19 NOTE — Patient Instructions (Addendum)
Thank you for coming in today. STOP nortryptline at bedtime.  Start trazodone for insomnia.  Increase topamax for headache prevention.  Restart adderall for ADHD/mental fog.   For weight use something like myfitness pal or other calorie counting applications to keep track of calories ins and outs.   Goal for 1700 calories per day.   Measure and log everything you eat and drink for 1 week.   Continue exercise.   Recheck in 1 month.  Keep me updated.

## 2019-10-21 ENCOUNTER — Encounter: Payer: BC Managed Care – PPO | Admitting: Speech Pathology

## 2019-10-21 ENCOUNTER — Ambulatory Visit: Payer: BC Managed Care – PPO

## 2019-10-21 ENCOUNTER — Other Ambulatory Visit: Payer: Self-pay

## 2019-10-21 DIAGNOSIS — M25612 Stiffness of left shoulder, not elsewhere classified: Secondary | ICD-10-CM | POA: Diagnosis not present

## 2019-10-21 DIAGNOSIS — M25512 Pain in left shoulder: Secondary | ICD-10-CM | POA: Diagnosis not present

## 2019-10-21 DIAGNOSIS — R2689 Other abnormalities of gait and mobility: Secondary | ICD-10-CM | POA: Diagnosis not present

## 2019-10-21 DIAGNOSIS — R41840 Attention and concentration deficit: Secondary | ICD-10-CM | POA: Diagnosis not present

## 2019-10-21 DIAGNOSIS — R42 Dizziness and giddiness: Secondary | ICD-10-CM

## 2019-10-21 DIAGNOSIS — R471 Dysarthria and anarthria: Secondary | ICD-10-CM | POA: Diagnosis not present

## 2019-10-21 DIAGNOSIS — R41842 Visuospatial deficit: Secondary | ICD-10-CM | POA: Diagnosis not present

## 2019-10-21 DIAGNOSIS — M6281 Muscle weakness (generalized): Secondary | ICD-10-CM | POA: Diagnosis not present

## 2019-10-21 DIAGNOSIS — R2681 Unsteadiness on feet: Secondary | ICD-10-CM | POA: Diagnosis not present
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
## 2019-10-22 ENCOUNTER — Ambulatory Visit: Payer: BC Managed Care – PPO | Admitting: Occupational Therapy

## 2019-10-22 DIAGNOSIS — R471 Dysarthria and anarthria: Secondary | ICD-10-CM | POA: Diagnosis not present

## 2019-10-22 DIAGNOSIS — M25512 Pain in left shoulder: Secondary | ICD-10-CM

## 2019-10-22 DIAGNOSIS — R41840 Attention and concentration deficit: Secondary | ICD-10-CM | POA: Diagnosis not present

## 2019-10-22 DIAGNOSIS — M6281 Muscle weakness (generalized): Secondary | ICD-10-CM | POA: Diagnosis not present

## 2019-10-22 DIAGNOSIS — R2689 Other abnormalities of gait and mobility: Secondary | ICD-10-CM | POA: Diagnosis not present

## 2019-10-22 DIAGNOSIS — M25612 Stiffness of left shoulder, not elsewhere classified: Secondary | ICD-10-CM

## 2019-10-22 DIAGNOSIS — R41842 Visuospatial deficit: Secondary | ICD-10-CM | POA: Diagnosis not present

## 2019-10-22 DIAGNOSIS — R2681 Unsteadiness on feet: Secondary | ICD-10-CM | POA: Diagnosis not present

## 2019-10-22 DIAGNOSIS — R42 Dizziness and giddiness: Secondary | ICD-10-CM | POA: Diagnosis not present

## 2019-10-22 NOTE — Patient Instructions (Signed)
Kinesiotape instructions The tape on your left shoulder can be left on for 3-5 days. If you experience increased pain or irritation, wet the tape and remove very slowly. When showering do not scrub over the tape, just let water run on that shoulder. Pat tape dry after shower.
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
## 2019-10-23 DIAGNOSIS — Z79899 Other long term (current) drug therapy: Secondary | ICD-10-CM | POA: Diagnosis not present

## 2019-10-23 DIAGNOSIS — R300 Dysuria: Secondary | ICD-10-CM | POA: Diagnosis not present

## 2019-10-23 DIAGNOSIS — F649 Gender identity disorder, unspecified: Secondary | ICD-10-CM | POA: Diagnosis not present

## 2019-10-26 ENCOUNTER — Ambulatory Visit: Payer: BC Managed Care – PPO | Admitting: Occupational Therapy

## 2019-10-26 ENCOUNTER — Other Ambulatory Visit: Payer: Self-pay

## 2019-10-26 ENCOUNTER — Ambulatory Visit: Payer: BC Managed Care – PPO

## 2019-10-26 ENCOUNTER — Encounter: Payer: BC Managed Care – PPO | Admitting: Speech Pathology

## 2019-10-26 ENCOUNTER — Encounter: Payer: Self-pay | Admitting: Occupational Therapy

## 2019-10-26 DIAGNOSIS — R2689 Other abnormalities of gait and mobility: Secondary | ICD-10-CM | POA: Diagnosis not present

## 2019-10-26 DIAGNOSIS — R41842 Visuospatial deficit: Secondary | ICD-10-CM

## 2019-10-26 DIAGNOSIS — R42 Dizziness and giddiness: Secondary | ICD-10-CM | POA: Diagnosis not present

## 2019-10-26 DIAGNOSIS — M25612 Stiffness of left shoulder, not elsewhere classified: Secondary | ICD-10-CM | POA: Diagnosis not present

## 2019-10-26 DIAGNOSIS — R2681 Unsteadiness on feet: Secondary | ICD-10-CM | POA: Diagnosis not present

## 2019-10-26 DIAGNOSIS — M6281 Muscle weakness (generalized): Secondary | ICD-10-CM

## 2019-10-26 DIAGNOSIS — M25512 Pain in left shoulder: Secondary | ICD-10-CM

## 2019-10-26 DIAGNOSIS — R471 Dysarthria and anarthria: Secondary | ICD-10-CM | POA: Diagnosis not present

## 2019-10-26 DIAGNOSIS — R41840 Attention and concentration deficit: Secondary | ICD-10-CM

## 2019-10-26 NOTE — Patient Instructions (Signed)
  PComposite Extension (Customer service manager)    Sitting with elbows on table and palms together, slowly lower wrists toward table until stretch is felt. Be sure to keep palms together throughout stretch. Hold 20 seconds. Relax.   Repeat 3 times. Do 1-2 sessions per day.    Wrist Passive Flexion    Rest right forearm on table, hand palm-down over edge. Bend wrist by pressing hand down with other hand. Hold 20 seconds. Repeat 3 times. Do 1-2 sessions per day.   Cane Exercise: Abduction / Adduction    Lay down, Hold shoebox with hands on the side. Move shoebox out to the side side to side over chest.  Repeat 10 times. Do  1-2 sessions per day.
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
## 2019-10-28 ENCOUNTER — Ambulatory Visit: Payer: BC Managed Care – PPO | Admitting: Physical Therapy

## 2019-10-28 ENCOUNTER — Ambulatory Visit: Payer: BC Managed Care – PPO | Admitting: Occupational Therapy

## 2019-10-28 ENCOUNTER — Encounter: Payer: BC Managed Care – PPO | Admitting: Speech Pathology

## 2019-10-28 ENCOUNTER — Other Ambulatory Visit: Payer: Self-pay

## 2019-10-28 DIAGNOSIS — R41842 Visuospatial deficit: Secondary | ICD-10-CM | POA: Diagnosis not present

## 2019-10-28 DIAGNOSIS — M25612 Stiffness of left shoulder, not elsewhere classified: Secondary | ICD-10-CM | POA: Diagnosis not present

## 2019-10-28 DIAGNOSIS — M6281 Muscle weakness (generalized): Secondary | ICD-10-CM

## 2019-10-28 DIAGNOSIS — R471 Dysarthria and anarthria: Secondary | ICD-10-CM | POA: Diagnosis not present

## 2019-10-28 DIAGNOSIS — R2689 Other abnormalities of gait and mobility: Secondary | ICD-10-CM | POA: Diagnosis not present

## 2019-10-28 DIAGNOSIS — R42 Dizziness and giddiness: Secondary | ICD-10-CM | POA: Diagnosis not present

## 2019-10-28 DIAGNOSIS — M25512 Pain in left shoulder: Secondary | ICD-10-CM | POA: Diagnosis not present

## 2019-10-28 DIAGNOSIS — R2681 Unsteadiness on feet: Secondary | ICD-10-CM | POA: Diagnosis not present

## 2019-10-28 DIAGNOSIS — R41840 Attention and concentration deficit: Secondary | ICD-10-CM | POA: Diagnosis not present
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
## 2019-10-28 NOTE — Patient Instructions (Signed)

## 2019-11-01 DIAGNOSIS — R569 Unspecified convulsions: Secondary | ICD-10-CM

## 2019-11-01 HISTORY — DX: Unspecified convulsions: R56.9

## 2019-11-02 ENCOUNTER — Ambulatory Visit: Payer: BC Managed Care – PPO

## 2019-11-02 ENCOUNTER — Ambulatory Visit: Payer: BC Managed Care – PPO | Attending: Family Medicine | Admitting: Occupational Therapy

## 2019-11-02 ENCOUNTER — Other Ambulatory Visit: Payer: Self-pay

## 2019-11-02 DIAGNOSIS — R41842 Visuospatial deficit: Secondary | ICD-10-CM | POA: Insufficient documentation

## 2019-11-02 DIAGNOSIS — R2681 Unsteadiness on feet: Secondary | ICD-10-CM | POA: Diagnosis not present

## 2019-11-02 DIAGNOSIS — G40909 Epilepsy, unspecified, not intractable, without status epilepticus: Secondary | ICD-10-CM | POA: Diagnosis not present

## 2019-11-02 DIAGNOSIS — R569 Unspecified convulsions: Secondary | ICD-10-CM | POA: Diagnosis not present

## 2019-11-02 DIAGNOSIS — R41840 Attention and concentration deficit: Secondary | ICD-10-CM | POA: Diagnosis not present

## 2019-11-02 DIAGNOSIS — I10 Essential (primary) hypertension: Secondary | ICD-10-CM | POA: Diagnosis not present

## 2019-11-02 DIAGNOSIS — R4781 Slurred speech: Secondary | ICD-10-CM | POA: Diagnosis not present

## 2019-11-02 DIAGNOSIS — R2689 Other abnormalities of gait and mobility: Secondary | ICD-10-CM | POA: Diagnosis not present

## 2019-11-02 DIAGNOSIS — M25512 Pain in left shoulder: Secondary | ICD-10-CM | POA: Diagnosis not present

## 2019-11-02 DIAGNOSIS — M25612 Stiffness of left shoulder, not elsewhere classified: Secondary | ICD-10-CM | POA: Diagnosis not present

## 2019-11-02 DIAGNOSIS — M6281 Muscle weakness (generalized): Secondary | ICD-10-CM | POA: Diagnosis not present

## 2019-11-02 DIAGNOSIS — R4182 Altered mental status, unspecified: Secondary | ICD-10-CM | POA: Diagnosis not present
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
## 2019-11-02 NOTE — Patient Instructions (Signed)
Wrist Extension: Isometric    With left forearm resting palm down on thigh, resist upward movement of hand with other hand. Hold __10__ seconds. Relax. Repeat _5___ times per set. Do __1__ sets per session. Do __2__ sessions per day.  Wrist Radial Deviation: Isometric    With left forearm resting on thigh, thumb up, use other hand to resist upward movement of hand at wrist. Hold __10__ seconds. Relax. Repeat _5___ times per set. Do __1__ sets per session. Do __2__ sessions per day.  Flexion (Isometric)    With forearm held steady, palm up, use other hand to resist upward movement of hand at wrist. Hold __10__ seconds. Relax. Repeat _5___ times. Do __2__ sessions per day.

## 2019-11-04 ENCOUNTER — Encounter: Payer: Self-pay | Admitting: Occupational Therapy

## 2019-11-04 ENCOUNTER — Ambulatory Visit: Payer: BC Managed Care – PPO

## 2019-11-04 ENCOUNTER — Other Ambulatory Visit: Payer: Self-pay

## 2019-11-04 ENCOUNTER — Ambulatory Visit: Payer: BC Managed Care – PPO | Admitting: Occupational Therapy

## 2019-11-04 DIAGNOSIS — R41840 Attention and concentration deficit: Secondary | ICD-10-CM

## 2019-11-04 DIAGNOSIS — M6281 Muscle weakness (generalized): Secondary | ICD-10-CM

## 2019-11-04 DIAGNOSIS — M25612 Stiffness of left shoulder, not elsewhere classified: Secondary | ICD-10-CM

## 2019-11-04 DIAGNOSIS — M25512 Pain in left shoulder: Secondary | ICD-10-CM
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
## 2019-11-06 ENCOUNTER — Encounter: Payer: Self-pay | Admitting: Family Medicine

## 2019-11-06 DIAGNOSIS — R42 Dizziness and giddiness: Secondary | ICD-10-CM

## 2019-11-06 DIAGNOSIS — S060X0D Concussion without loss of consciousness, subsequent encounter: Secondary | ICD-10-CM

## 2019-11-09 ENCOUNTER — Other Ambulatory Visit: Payer: Self-pay

## 2019-11-09 ENCOUNTER — Ambulatory Visit: Payer: BC Managed Care – PPO

## 2019-11-09 ENCOUNTER — Ambulatory Visit: Payer: BC Managed Care – PPO | Admitting: Occupational Therapy

## 2019-11-09 ENCOUNTER — Encounter: Payer: Self-pay | Admitting: Occupational Therapy

## 2019-11-09 DIAGNOSIS — M25512 Pain in left shoulder: Secondary | ICD-10-CM

## 2019-11-09 DIAGNOSIS — R41842 Visuospatial deficit: Secondary | ICD-10-CM

## 2019-11-09 DIAGNOSIS — R2689 Other abnormalities of gait and mobility: Secondary | ICD-10-CM

## 2019-11-09 DIAGNOSIS — M25612 Stiffness of left shoulder, not elsewhere classified: Secondary | ICD-10-CM

## 2019-11-09 DIAGNOSIS — M6281 Muscle weakness (generalized): Secondary | ICD-10-CM

## 2019-11-09 DIAGNOSIS — R41840 Attention and concentration deficit: Secondary | ICD-10-CM

## 2019-11-09 DIAGNOSIS — R2681 Unsteadiness on feet: Secondary | ICD-10-CM

## 2019-11-09 NOTE — Patient Instructions (Addendum)
    Wear your wrist brace during the day when you are using your hand (chores/cleaning, etc.).  Remove at least 3x/day to stretch wrist and perform your exercises to prevent stiffness.  Remove if pain increases.  Try to remember if certain activities are increasing pain (write it down).     Have your husband help you write down a list of questions/concerns for your doctor's appointment.  Follow up on psychology referral.
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
## 2019-11-11 ENCOUNTER — Ambulatory Visit: Payer: BC Managed Care – PPO

## 2019-11-11 ENCOUNTER — Other Ambulatory Visit: Payer: Self-pay

## 2019-11-11 ENCOUNTER — Ambulatory Visit: Payer: BC Managed Care – PPO | Admitting: Occupational Therapy

## 2019-11-11 DIAGNOSIS — R41840 Attention and concentration deficit: Secondary | ICD-10-CM

## 2019-11-11 DIAGNOSIS — M6281 Muscle weakness (generalized): Secondary | ICD-10-CM

## 2019-11-11 DIAGNOSIS — M25512 Pain in left shoulder: Secondary | ICD-10-CM

## 2019-11-11 DIAGNOSIS — M25612 Stiffness of left shoulder, not elsewhere classified: Secondary | ICD-10-CM
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
## 2019-11-11 NOTE — Patient Instructions (Signed)
1. Grip Strengthening (Resistive Putty)   Squeeze putty using thumb and all fingers. Repeat _20___ times. Do __2__ sessions per day.   2. Roll putty into tube on table and pinch between first two fingers and thumb x 10 reps. Do 2 sessions per day     Copyright  VHI. All rights reserved.     

## 2019-11-16 ENCOUNTER — Ambulatory Visit: Payer: BC Managed Care – PPO | Admitting: Family Medicine

## 2019-11-16 ENCOUNTER — Ambulatory Visit: Payer: BC Managed Care – PPO | Admitting: Occupational Therapy

## 2019-11-17 ENCOUNTER — Ambulatory Visit (INDEPENDENT_AMBULATORY_CARE_PROVIDER_SITE_OTHER): Payer: BC Managed Care – PPO | Admitting: Family Medicine

## 2019-11-17 ENCOUNTER — Encounter: Payer: Self-pay | Admitting: Family Medicine

## 2019-11-17 ENCOUNTER — Other Ambulatory Visit: Payer: Self-pay

## 2019-11-17 VITALS — BP 112/76 | HR 97 | Ht 67.0 in | Wt 179.0 lb

## 2019-11-17 DIAGNOSIS — R110 Nausea: Secondary | ICD-10-CM

## 2019-11-17 DIAGNOSIS — F0781 Postconcussional syndrome: Secondary | ICD-10-CM | POA: Insufficient documentation

## 2019-11-17 DIAGNOSIS — F329 Major depressive disorder, single episode, unspecified: Secondary | ICD-10-CM

## 2019-11-17 DIAGNOSIS — F419 Anxiety disorder, unspecified: Secondary | ICD-10-CM | POA: Diagnosis not present

## 2019-11-17 DIAGNOSIS — S060X0D Concussion without loss of consciousness, subsequent encounter: Secondary | ICD-10-CM | POA: Diagnosis not present

## 2019-11-17 MED ORDER — BUSPIRONE HCL 10 MG PO TABS
10.0000 mg | ORAL_TABLET | Freq: Three times a day (TID) | ORAL | 2 refills | Status: DC | PRN
Start: 2019-11-17 — End: 2020-03-01

## 2019-11-17 MED ORDER — OMEPRAZOLE 40 MG PO CPDR: 40.0000 mg | DELAYED_RELEASE_CAPSULE | Freq: Every day | ORAL | 1 refills | Status: DC
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
## 2019-11-17 NOTE — Patient Instructions (Addendum)
Thank you for coming in today.  Plan for acid blocking medicine omeprazole. See if that helps with nausea.   Plan for second referral to psychiatry.   For anxiety first try is buspar or CBD or both in addition to what you are already taking.  Will try to get you into psychiatry.   If not able I am going to recommend we try something else like cymbalta.   Think of your brain as a TBI.   Otherwise recheck in 1 month.

## 2019-11-19 ENCOUNTER — Encounter: Payer: Self-pay | Admitting: Occupational Therapy

## 2019-11-19 ENCOUNTER — Other Ambulatory Visit: Payer: Self-pay

## 2019-11-19 ENCOUNTER — Ambulatory Visit: Payer: BC Managed Care – PPO | Admitting: Occupational Therapy

## 2019-11-19 DIAGNOSIS — M25612 Stiffness of left shoulder, not elsewhere classified: Secondary | ICD-10-CM

## 2019-11-19 DIAGNOSIS — R41840 Attention and concentration deficit: Secondary | ICD-10-CM

## 2019-11-19 DIAGNOSIS — R2681 Unsteadiness on feet: Secondary | ICD-10-CM

## 2019-11-19 DIAGNOSIS — M6281 Muscle weakness (generalized): Secondary | ICD-10-CM

## 2019-11-19 DIAGNOSIS — R41842 Visuospatial deficit: Secondary | ICD-10-CM

## 2019-11-19 DIAGNOSIS — M25512 Pain in left shoulder: Secondary | ICD-10-CM
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
## 2019-11-19 NOTE — Patient Instructions (Signed)
Closed Chain: Shoulder External Rotation - on Wall    One hand on wall down low, rotate shoulder by stepping outward until you feel a stretch. (can progress to raising hand on wall) Hold 20sec, 3 repetitions, 1-2 times per day.

## 2019-11-23 ENCOUNTER — Other Ambulatory Visit: Payer: Self-pay

## 2019-11-23 ENCOUNTER — Ambulatory Visit: Payer: BC Managed Care – PPO | Admitting: Occupational Therapy

## 2019-11-23 DIAGNOSIS — M6281 Muscle weakness (generalized): Secondary | ICD-10-CM

## 2019-11-23 DIAGNOSIS — M25512 Pain in left shoulder: Secondary | ICD-10-CM

## 2019-11-23 DIAGNOSIS — M25612 Stiffness of left shoulder, not elsewhere classified: Secondary | ICD-10-CM
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
## 2019-11-26 ENCOUNTER — Other Ambulatory Visit: Payer: Self-pay

## 2019-11-26 ENCOUNTER — Encounter: Payer: Self-pay | Admitting: Occupational Therapy

## 2019-11-26 ENCOUNTER — Ambulatory Visit: Payer: BC Managed Care – PPO | Admitting: Occupational Therapy

## 2019-11-26 DIAGNOSIS — M25612 Stiffness of left shoulder, not elsewhere classified: Secondary | ICD-10-CM

## 2019-11-26 DIAGNOSIS — F411 Generalized anxiety disorder: Secondary | ICD-10-CM | POA: Diagnosis not present

## 2019-11-26 DIAGNOSIS — G4700 Insomnia, unspecified: Secondary | ICD-10-CM | POA: Diagnosis not present

## 2019-11-26 DIAGNOSIS — F988 Other specified behavioral and emotional disorders with onset usually occurring in childhood and adolescence: Secondary | ICD-10-CM | POA: Diagnosis not present

## 2019-11-26 DIAGNOSIS — R41842 Visuospatial deficit: Secondary | ICD-10-CM

## 2019-11-26 DIAGNOSIS — R41840 Attention and concentration deficit: Secondary | ICD-10-CM

## 2019-11-26 DIAGNOSIS — M6281 Muscle weakness (generalized): Secondary | ICD-10-CM

## 2019-11-26 DIAGNOSIS — F331 Major depressive disorder, recurrent, moderate: Secondary | ICD-10-CM | POA: Diagnosis not present

## 2019-11-26 DIAGNOSIS — R2681 Unsteadiness on feet: Secondary | ICD-10-CM

## 2019-11-26 DIAGNOSIS — M25512 Pain in left shoulder: Secondary | ICD-10-CM
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
## 2019-11-27 DIAGNOSIS — Z113 Encounter for screening for infections with a predominantly sexual mode of transmission: Secondary | ICD-10-CM | POA: Diagnosis not present

## 2019-11-27 DIAGNOSIS — R309 Painful micturition, unspecified: Secondary | ICD-10-CM | POA: Diagnosis not present

## 2019-11-30 ENCOUNTER — Ambulatory Visit: Payer: BC Managed Care – PPO | Admitting: Occupational Therapy

## 2019-11-30 ENCOUNTER — Emergency Department (HOSPITAL_COMMUNITY): Payer: BC Managed Care – PPO

## 2019-11-30 ENCOUNTER — Emergency Department (EMERGENCY_DEPARTMENT_HOSPITAL)
Admission: EM | Admit: 2019-11-30 | Discharge: 2019-11-30 | Disposition: A | Discharge status: Home / Self Care | Payer: BC Managed Care – PPO | Source: Home / Self Care | Attending: Emergency Medicine | Admitting: Emergency Medicine

## 2019-11-30 ENCOUNTER — Encounter: Payer: Self-pay | Admitting: Occupational Therapy

## 2019-11-30 ENCOUNTER — Other Ambulatory Visit: Payer: Self-pay

## 2019-11-30 ENCOUNTER — Encounter (HOSPITAL_COMMUNITY): Payer: Self-pay

## 2019-11-30 VITALS — BP 101/67 | HR 84

## 2019-11-30 DIAGNOSIS — R4781 Slurred speech: Secondary | ICD-10-CM | POA: Diagnosis not present

## 2019-11-30 DIAGNOSIS — R4020 Unspecified coma: Secondary | ICD-10-CM | POA: Diagnosis not present

## 2019-11-30 DIAGNOSIS — R531 Weakness: Secondary | ICD-10-CM | POA: Insufficient documentation

## 2019-11-30 DIAGNOSIS — R41840 Attention and concentration deficit: Secondary | ICD-10-CM

## 2019-11-30 DIAGNOSIS — I10 Essential (primary) hypertension: Secondary | ICD-10-CM | POA: Diagnosis not present

## 2019-11-30 DIAGNOSIS — M25512 Pain in left shoulder: Secondary | ICD-10-CM

## 2019-11-30 DIAGNOSIS — J45909 Unspecified asthma, uncomplicated: Secondary | ICD-10-CM | POA: Insufficient documentation

## 2019-11-30 DIAGNOSIS — R569 Unspecified convulsions: Secondary | ICD-10-CM | POA: Insufficient documentation

## 2019-11-30 DIAGNOSIS — F29 Unspecified psychosis not due to a substance or known physiological condition: Secondary | ICD-10-CM | POA: Diagnosis not present

## 2019-11-30 DIAGNOSIS — M6281 Muscle weakness (generalized): Secondary | ICD-10-CM

## 2019-11-30 DIAGNOSIS — R41842 Visuospatial deficit: Secondary | ICD-10-CM

## 2019-11-30 DIAGNOSIS — M25612 Stiffness of left shoulder, not elsewhere classified: Secondary | ICD-10-CM

## 2019-11-30 DIAGNOSIS — R4182 Altered mental status, unspecified: Secondary | ICD-10-CM | POA: Insufficient documentation

## 2019-11-30 DIAGNOSIS — G40909 Epilepsy, unspecified, not intractable, without status epilepticus: Secondary | ICD-10-CM | POA: Diagnosis not present

## 2019-11-30 DIAGNOSIS — R52 Pain, unspecified: Secondary | ICD-10-CM | POA: Diagnosis not present

## 2019-11-30 DIAGNOSIS — Z79899 Other long term (current) drug therapy: Secondary | ICD-10-CM | POA: Insufficient documentation

## 2019-11-30 LAB — CBC WITH DIFFERENTIAL/PLATELET
Abs Immature Granulocytes: 0.02 10*3/uL (ref 0.00–0.07)
Basophils Absolute: 0.1 10*3/uL (ref 0.0–0.1)
Basophils Relative: 1 %
Eosinophils Absolute: 0.4 10*3/uL (ref 0.0–0.5)
Eosinophils Relative: 5 %
HCT: 42.4 % (ref 39.0–52.0)
Hemoglobin: 14.3 g/dL (ref 13.0–17.0)
Immature Granulocytes: 0 %
Lymphocytes Relative: 27 %
Lymphs Abs: 2.3 10*3/uL (ref 0.7–4.0)
MCH: 28.9 pg (ref 26.0–34.0)
MCHC: 33.7 g/dL (ref 30.0–36.0)
MCV: 85.8 fL (ref 80.0–100.0)
Monocytes Absolute: 0.6 10*3/uL (ref 0.1–1.0)
Monocytes Relative: 7 %
Neutro Abs: 5.2 10*3/uL (ref 1.7–7.7)
Neutrophils Relative %: 60 %
Platelets: 300 10*3/uL (ref 150–400)
RBC: 4.94 MIL/uL (ref 4.22–5.81)
RDW: 12.4 % (ref 11.5–15.5)
WBC: 8.5 10*3/uL (ref 4.0–10.5)
nRBC: 0 % (ref 0.0–0.2)

## 2019-11-30 LAB — URINALYSIS, ROUTINE W REFLEX MICROSCOPIC
Bacteria, UA: NONE SEEN
Bilirubin Urine: NEGATIVE
Glucose, UA: NEGATIVE mg/dL
Hgb urine dipstick: NEGATIVE
Ketones, ur: NEGATIVE mg/dL
Nitrite: NEGATIVE
Protein, ur: NEGATIVE mg/dL
Specific Gravity, Urine: 1.008 (ref 1.005–1.030)
pH: 6 (ref 5.0–8.0)

## 2019-11-30 LAB — TROPONIN I (HIGH SENSITIVITY): Troponin I (High Sensitivity): <2 ng/L (ref <18)

## 2019-11-30 LAB — COMPREHENSIVE METABOLIC PANEL
ALT: 17 U/L (ref 0–44)
AST: 15 U/L (ref 15–41)
Albumin: 4.2 g/dL (ref 3.5–5.0)
Alkaline Phosphatase: 44 U/L (ref 38–126)
Anion gap: 10 (ref 5–15)
BUN: 11 mg/dL (ref 6–20)
CO2: 21 mmol/L — ABNORMAL LOW (ref 22–32)
Calcium: 8.9 mg/dL (ref 8.9–10.3)
Chloride: 110 mmol/L (ref 98–111)
Creatinine, Ser: 1.07 mg/dL (ref 0.61–1.24)
GFR calc Af Amer: >60 mL/min (ref >60)
GFR calc non Af Amer: >60 mL/min (ref >60)
Glucose, Bld: 93 mg/dL (ref 70–99)
Potassium: 3.8 mmol/L (ref 3.5–5.1)
Sodium: 141 mmol/L (ref 135–145)
Total Bilirubin: 0.5 mg/dL (ref 0.3–1.2)
Total Protein: 6.5 g/dL (ref 6.5–8.1)

## 2019-11-30 LAB — RAPID URINE DRUG SCREEN, HOSP PERFORMED
Amphetamines: NOT DETECTED
Barbiturates: NOT DETECTED
Benzodiazepines: NOT DETECTED
Cocaine: NOT DETECTED
Opiates: NOT DETECTED
Tetrahydrocannabinol: NOT DETECTED

## 2019-11-30 LAB — SALICYLATE LEVEL: Salicylate Lvl: <7 mg/dL — ABNORMAL LOW (ref 7.0–30.0)

## 2019-11-30 LAB — ACETAMINOPHEN LEVEL: Acetaminophen (Tylenol), Serum: <10 ug/mL — ABNORMAL LOW (ref 10–30)

## 2019-11-30 LAB — LIPASE, BLOOD: Lipase: 30 U/L (ref 11–51)

## 2019-11-30 IMAGING — DX DG CHEST 1V PORT
1 series · 1 of 1 positions shown · non-contrast
Comparison: None.

CLINICAL DATA: Altered mental status.

EXAM:
PORTABLE CHEST 1 VIEW

[chest ap]
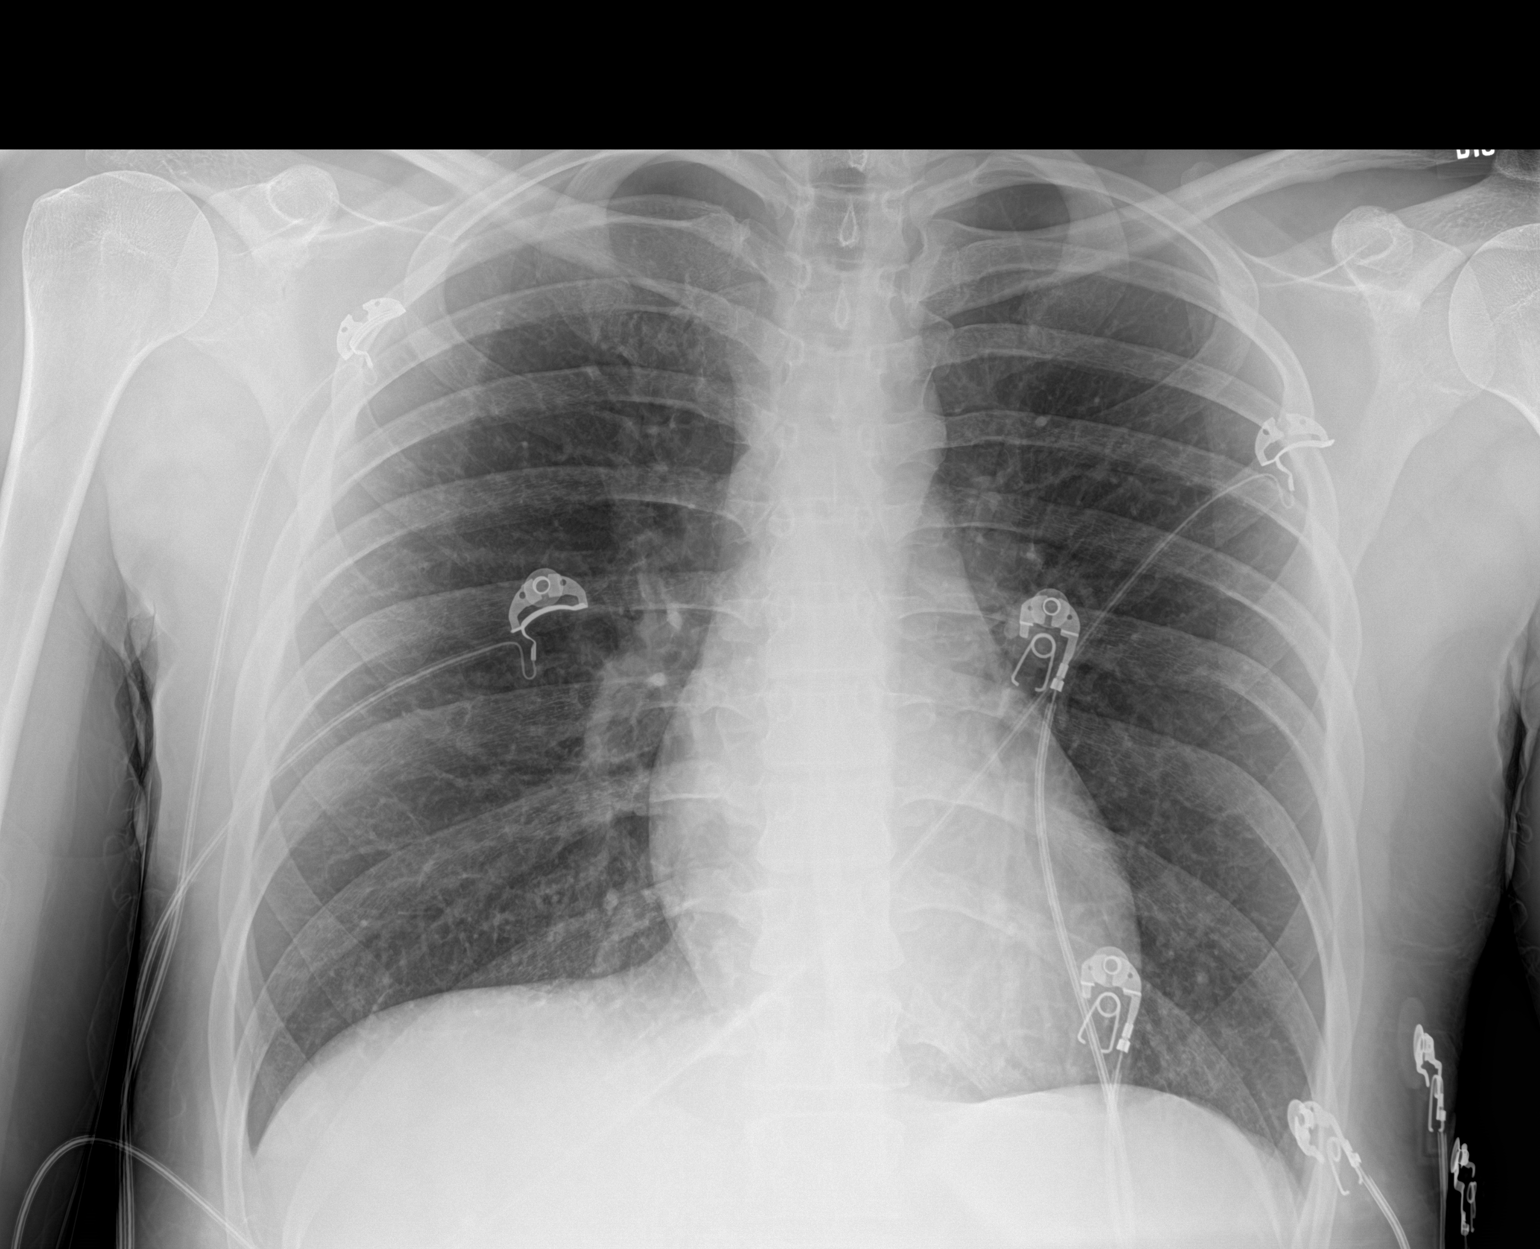

[1 of 1 positions shown; findings below may reference images not displayed]

FINDINGS: The heart size and mediastinal contours are within normal limits.
Both lungs are clear. No pleural effusion. No discernible
pneumothorax. The visualized skeletal structures are unremarkable.
IMPRESSION: No acute cardiopulmonary disease.

## 2019-11-30 IMAGING — CT CT HEAD W/O CM
3 of 4 series · 15 of 47 positions shown, 18 images · non-contrast
Comparison: [DATE] CT, [DATE] MR prior studies

CLINICAL DATA: 24-year-old male with acute slurred speech and
altered mental status/possible seizure today.

EXAM:
CT HEAD WITHOUT CONTRAST
TECHNIQUE: Contiguous axial images were obtained from the base of the skull
through the vertex without intravenous contrast.

[Series 4: head 2.0 h70h · axial · 0.43mm/px · z∈[-154,-40]mm · 9 of 73 slices shown, 12 images]
[im 8/73  brain]
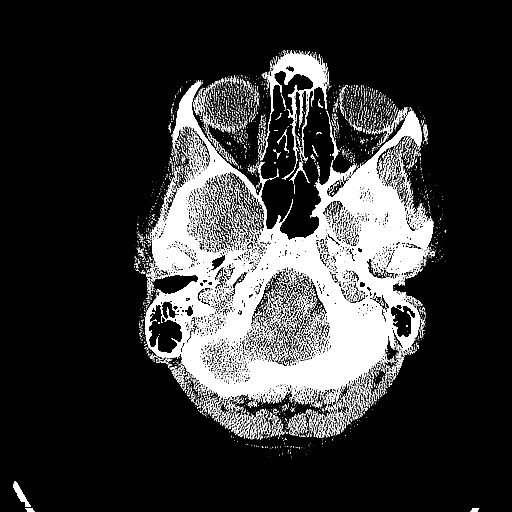
[im 8/73  bone]
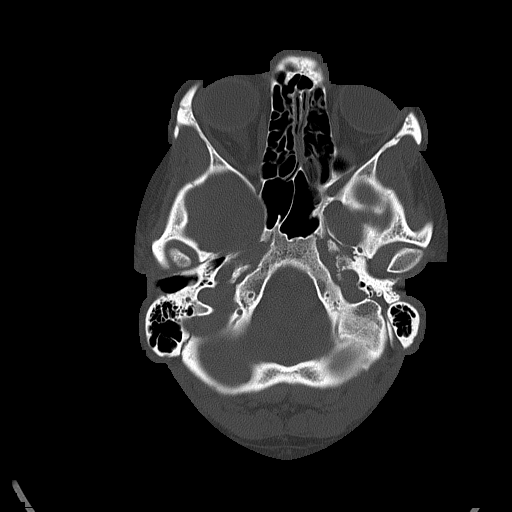
[im 15/73  brain]
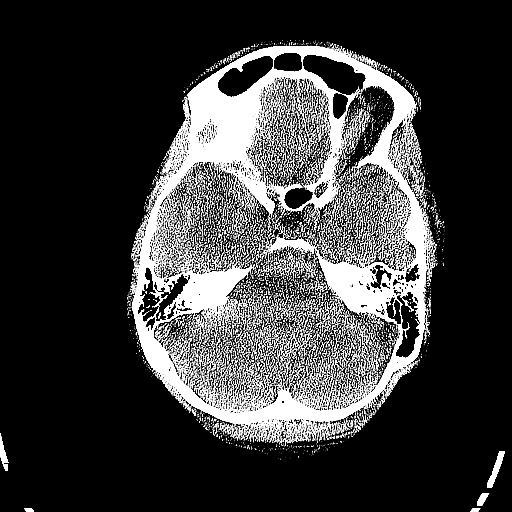
[im 22/73  brain]
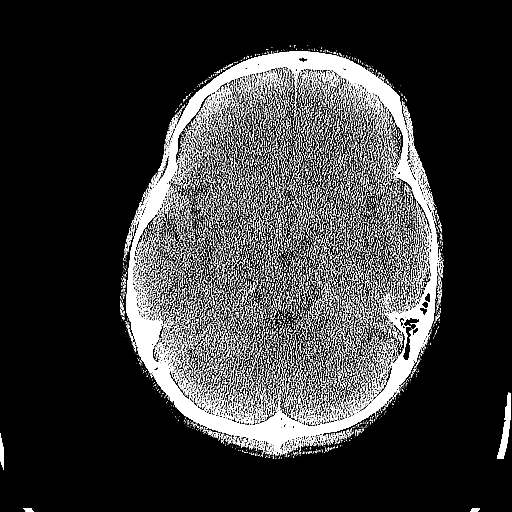
[im 29/73  brain]
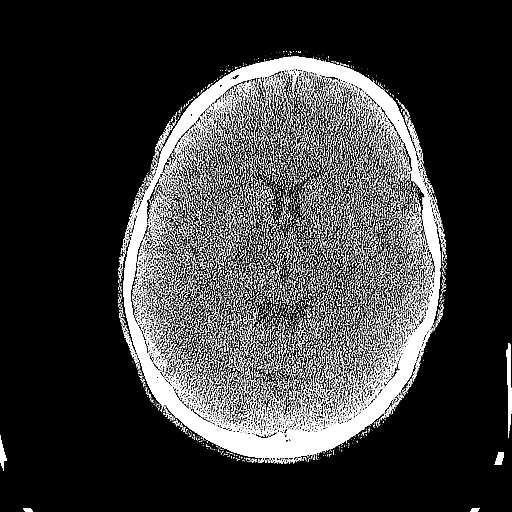
[im 37/73  brain]
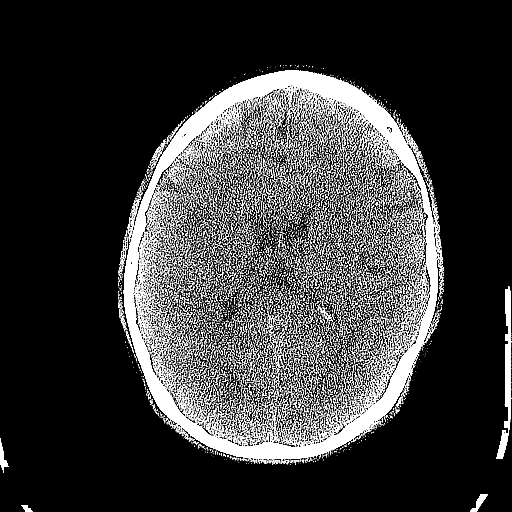
[im 37/73  bone]
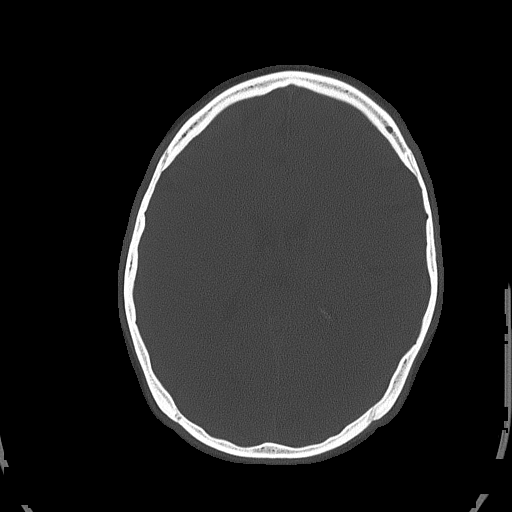
[im 44/73  brain]
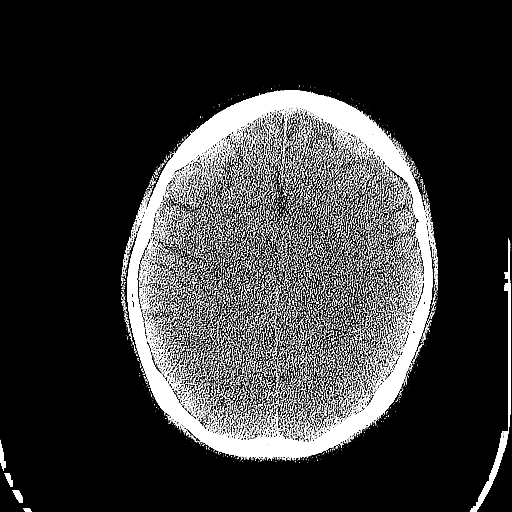
[im 51/73  brain]
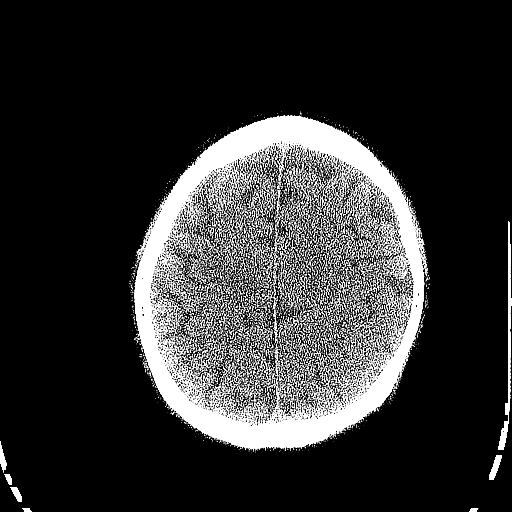
[im 58/73  brain]
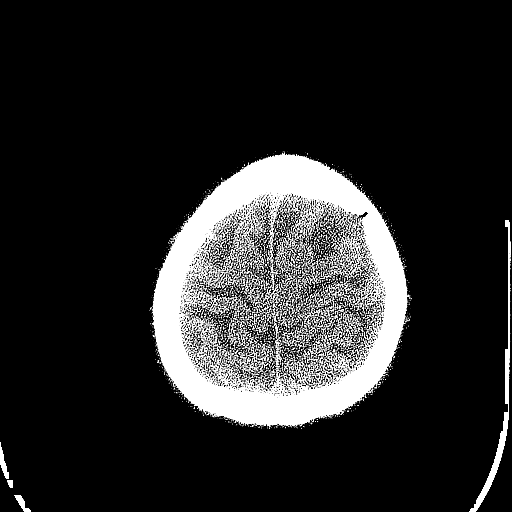
[im 65/73  brain]
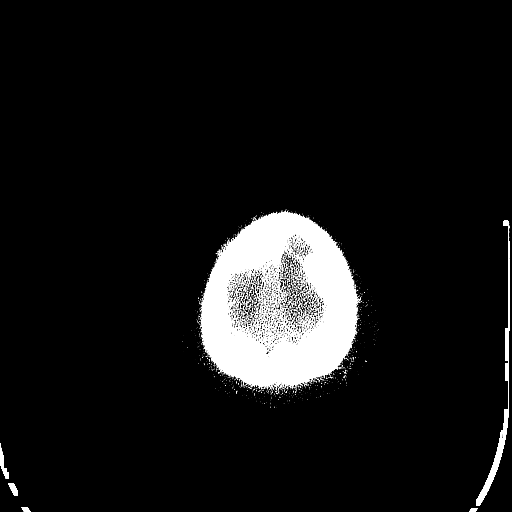
[im 65/73  bone]
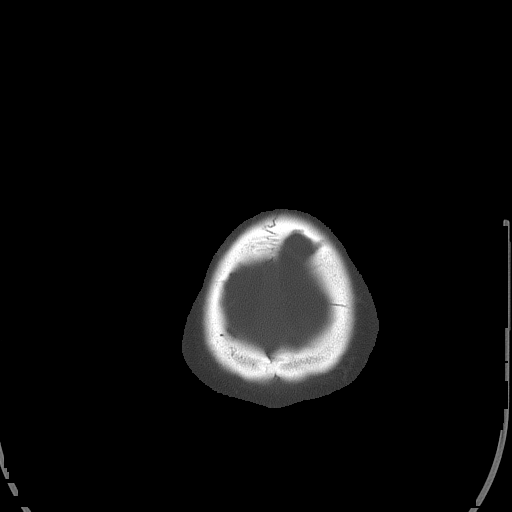

[Series 5: head 3.0 mpr cor · coronal · 0.31mm/px · 3 of 60 slices shown]
[im 20/60  brain]
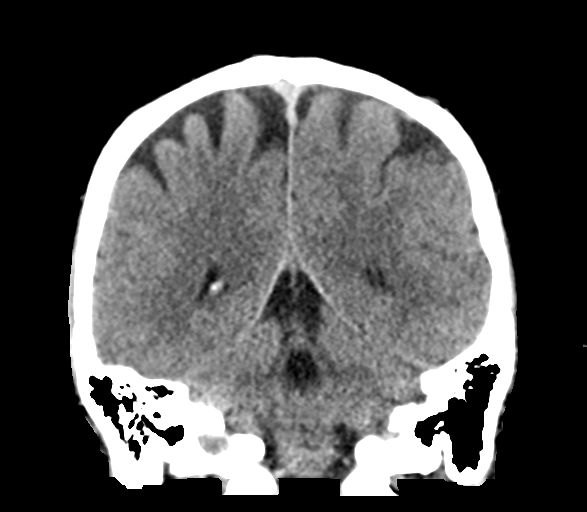
[im 27/60  brain]
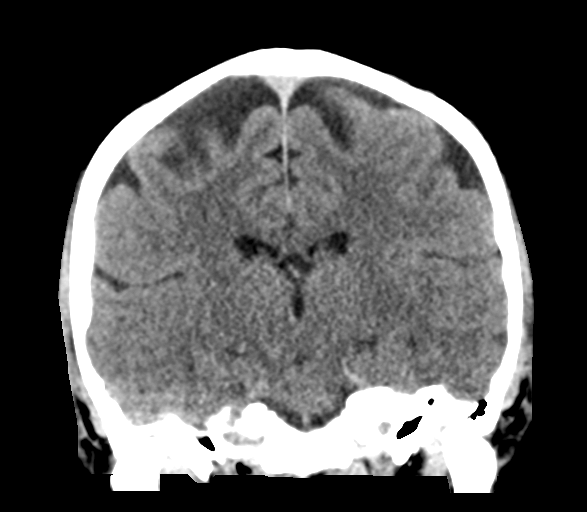
[im 33/60  brain]
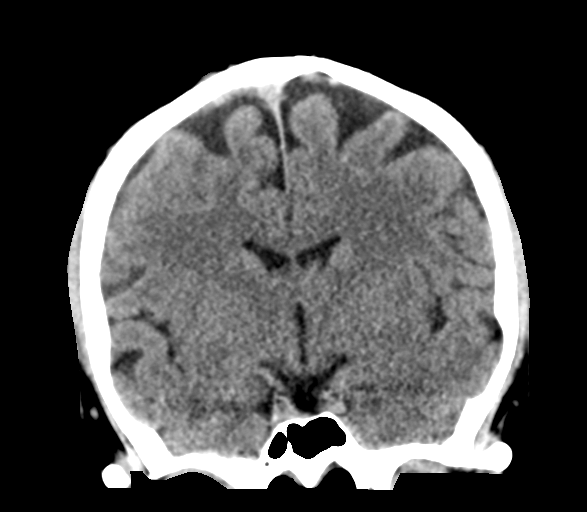

[Series 6: head 3.0 mpr sag · sagittal · 0.30mm/px · 3 of 52 slices shown]
[im 18/52  brain]
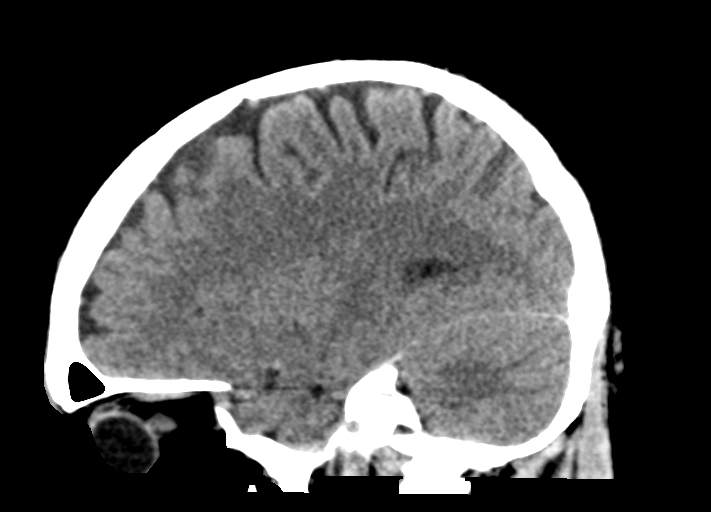
[im 26/52  brain]
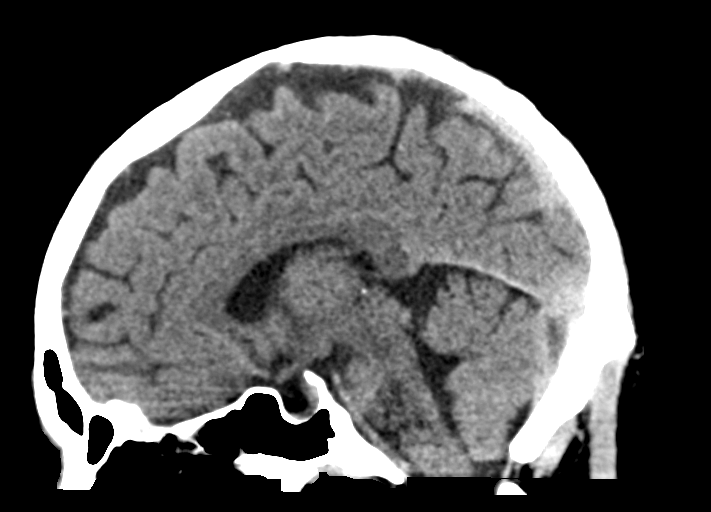
[im 35/52  brain]
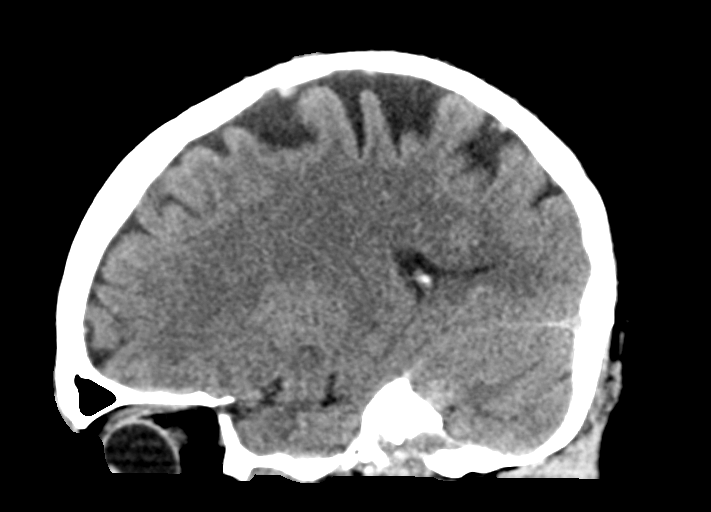

[15 of 47 positions shown; findings below may reference images not displayed]

FINDINGS: Brain: No evidence of acute infarction, hemorrhage, hydrocephalus,
extra-axial collection or mass lesion/mass effect.

Vascular: No hyperdense vessel or unexpected calcification.

Skull: Normal. Negative for fracture or focal lesion.

Sinuses/Orbits: No acute finding.

Other: None.
IMPRESSION: Unremarkable noncontrast head CT.

## 2019-11-30 MED ORDER — LEVETIRACETAM IN NACL 1000 MG/100ML IV SOLN
1000.0000 mg | Freq: Once | INTRAVENOUS | Status: AC
  Administered 2019-11-30: 1000 mg via INTRAVENOUS
  Filled 2019-11-30: qty 100

## 2019-11-30 NOTE — Discharge Instructions (Addendum)
As we discussed, your work-up today was reassuring.  Your head CT and your labs looked good and did not show any indicative reason as to your symptoms.  As we discussed, your EEG which looks at your brain waves did not show any seizure activity.  Below I provided some information regarding your symptoms.  It is important that you follow-up with your neurologist.  Please call them and arrange for an appointment.  Return to the emergency department for any persistent vomiting, difficulty walking, chest pain, difficulty breathing or any other worsening concerning symptoms.   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  o    One in six people also have epilepsy seizures or has had them in the past.  o    Nonepilepsy seizures may be associated with psychological conditions or other physical problems.   What are they?  Events that look like seizures but are not due to epilepsy are called "nonepileptic seizures." Some people prefer to use the term "events" rather than seizures. You will see the terms used interchangeably here. A common type is described as psychogenic ([id number]), which means beginning in the mind. Psychogenic seizures or events are caused by subconsious thoughts, emotions or 'stress', not abnormal electrical activity in the brain. Doctors consider most of them psychological in nature, but not purposely produced. Usually the person is not aware that the spells are not "epileptic." The term "pseudoseizures" has also been used in the past to refer to these events, but we prefer to avoid this term as it is not accurate and has a negative meaning.  It's important to know that some seizures that are not epilepsy could be caused by other physical problems. These are nonepilepsy seizures too, but not caused by a psychological condition. Further testing is needed to find the  exact cause so they can be treated properly.   Are they common?  Psychogenic nonepileptic events are common. About 20% of the patients referred to comprehensive epilepsy centers for video-EEG monitoring are found to have nonepileptic seizures. About 1 in 6 of these patients also has epileptic seizures or has had them in the past. Psychogenic nonepileptic events have been more widely recognized during the past several decades. They are most often seen in adolescents and young adults, but they also can occur in children and the elderly. They are three times more common in females.   What do they look like?  The events most often look like complex partial or tonic-clonic (grand mal) seizures. Family members report episodes in which the patient stiffens and jerks. Doctors rarely witness the actual event, so they are drawn toward the diagnosis of epilepsy. Often years can be spent trying to treat the spells as epileptic seizures without success.   How are they recognized?  Certain kinds of movements and other patterns seem to be more common in psychogenic nonepileptic events than in seizures caused by epilepsy. Some of these patterns do occur occasionally in epileptic seizures, however, so having one of them does not necessarily mean that the seizure was nonepileptic. Video-EEG monitoring is the best way of diagnosing nonepileptic seizures. The doctor may take steps to provoke a seizure and then ask a family member or friend to confirm that the event was the same as the usual kind.   Can they be treated?  The good news is that psychogenic nonepileptic events can respond  to treatment. A psychiatric evaluation helps sort out possible psychological problems and the types of treatment that may be needed. Being diagnosed with psychogenic seizures doesn't necessarily mean that a person has a serious psychiatric disorder. Yet treating the nonepilepsy or psychogenic seizures will involve treating whatever  psychological problems may be present.  o    Sometimes the episodes stop when the person learns that they are psychological. Learning what the diagnsis means and what it doesn't mean is very important.  o    Some people can learn how to control the events with behavioral techniques such as relaxation therapy or other forms of cognitive behavioral therapy.  o    Some people have depression or anxiety disorders that can be helped by medication.  o    Counseling for a limited time is often helpful. Both individual and family therapy may be recommended.  o    People who have both epilepsy and psychogenic nonepilepsy seizures or events will require seizure medication as well as treatments for the psychogenic events.
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
## 2019-11-30 NOTE — Progress Notes (Signed)
STAT EEG complete - results pending. ? ?
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
## 2019-11-30 NOTE — ED Triage Notes (Signed)
Pt arrived to ED via [facility] from his physical therapy appointment d/t the PT tech noticing pt having an "episode" that caused him to have slurred speech & appeared altered but did remain A/Ox4. EMS reports that pt remained in that state while in route to ED. Upon artrival to ED pt had a seizure while still on the EMS stretcher and in the hallway waiting to get in his assigned room. This RN witnessed the tremors he had that lasted approx. 30 seconds. Postictal state faded quick & the pot is beginning to have clearer speech, & he remains A/Ox4.

## 2019-12-01 DIAGNOSIS — H93292 Other abnormal auditory perceptions, left ear: Secondary | ICD-10-CM | POA: Diagnosis not present

## 2019-12-01 DIAGNOSIS — S069XAA Unspecified intracranial injury with loss of consciousness status unknown, initial encounter: Secondary | ICD-10-CM | POA: Insufficient documentation

## 2019-12-01 DIAGNOSIS — R42 Dizziness and giddiness: Secondary | ICD-10-CM | POA: Diagnosis not present

## 2019-12-01 DIAGNOSIS — Z87820 Personal history of traumatic brain injury: Secondary | ICD-10-CM | POA: Diagnosis not present

## 2019-12-03 ENCOUNTER — Ambulatory Visit (INDEPENDENT_AMBULATORY_CARE_PROVIDER_SITE_OTHER): Payer: BC Managed Care – PPO | Admitting: Family Medicine

## 2019-12-03 ENCOUNTER — Other Ambulatory Visit: Payer: Self-pay

## 2019-12-03 ENCOUNTER — Encounter: Payer: Self-pay | Admitting: Family Medicine

## 2019-12-03 ENCOUNTER — Ambulatory Visit: Payer: BC Managed Care – PPO | Admitting: Occupational Therapy

## 2019-12-03 VITALS — BP 120/82 | HR 89 | Ht 67.0 in | Wt 184.2 lb

## 2019-12-03 DIAGNOSIS — S060X0D Concussion without loss of consciousness, subsequent encounter: Secondary | ICD-10-CM | POA: Diagnosis not present

## 2019-12-03 DIAGNOSIS — F0781 Postconcussional syndrome: Secondary | ICD-10-CM

## 2019-12-03 DIAGNOSIS — F445 Conversion disorder with seizures or convulsions: Secondary | ICD-10-CM | POA: Diagnosis not present

## 2019-12-03 DIAGNOSIS — H93292 Other abnormal auditory perceptions, left ear: Secondary | ICD-10-CM | POA: Diagnosis not present

## 2019-12-03 MED ORDER — TOPIRAMATE 100 MG PO TABS: 100.0000 mg | ORAL_TABLET | Freq: Two times a day (BID) | ORAL | 2 refills | Status: DC
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
## 2019-12-03 NOTE — Patient Instructions (Addendum)
Thank you for coming in today.  Plan for second opinion neurology [facility].  Increase Topamax to 100 twice daily.   Follow up with psychiatry.   Keep me updated.     Non-Epileptic Seizures, Adult A seizure can cause:  Involuntary movements, like falling or shaking.  Changes in awareness or consciousness.  Convulsions. These are episodes of uncontrollable, jerking movement caused by sudden, intense tightening (contraction) of the muscles. Epileptic seizures are caused by abnormal electrical activity in the brain. Non-epileptic seizures are different. They are not caused by abnormal electrical signals in your brain. These seizures may look like epileptic seizures, but they are not caused by epilepsy. There are two types of non-epileptic seizures:  Physiologic non-epileptic seizure. This type results from an underlying problem that causes a disruption in your brain's electrical activity.  Psychogenic non-epileptic seizure. This type results from emotional stress. These seizures are sometimes called pseudoseizures. What are the causes? Causes of physiologic non-epileptic seizures can include:  Sudden drop in blood pressure.  Low blood sugar (glucose).  Low levels of salt (sodium) in your blood.  Low levels of calcium in your blood.  Migraine.  Heart rhythm problems.  Sleep disorders, such as narcolepsy.  Movement disorders, such as Tourette syndrome.  Infection.  Certain medicines.  Drug and alcohol abuse.  Fever. Common causes of psychogenic non-epileptic seizures can include:  Stress.  Emotional trauma.  Sexual or physical abuse.  Major life events, such as divorce or death of a loved one.  Mental health disorders, including anxiety and depression. What are the signs or symptoms? Symptoms of a non-epileptic seizure can be similar to those of an epileptic seizure, which may include:  A change in attention or behavior (altered mental status).  Loss of  consciousness or fainting.  Convulsions with rhythmic jerking movements.  Drooling.  Rapid eye movements.  Grunting.  Loss of bladder control and bowel control.  Bitter taste in the mouth.  Tongue biting. Some people experience unusual sensations (aura) before having a seizure. These can include:  "Butterflies" in the stomach.  Abnormal smells or tastes.  A feeling of having had a new experience before (dj vu). After a non-epileptic seizure, you may have a headache or sore muscles or feel confused and sleepy. Non-epileptic seizures usually:  Do not cause physical injuries.  Start slowly.  Include crying or shrieking.  Last longer than 2 minutes.  Include pelvic thrusting. How is this diagnosed? Non-epileptic seizures may be diagnosed by:  Your medical history.  A physical exam.  Your symptoms. ? Your health care provider may want to talk with your friends or relatives who have seen you have a seizure. ? If possible, it is helpful if you write down your seizure activity, including what led up to the seizure, and share that information with your health care provider. You may also need to have tests to look for causes of physiologic non-epileptic seizures. These may include:  An electroencephalogram (EEG). This test measures electrical activity in your brain. If you have had a non-epileptic seizure, the results of your EEG will likely be normal.  Video EEG. This test takes place in the hospital over the course of 2-7 days. The test uses a video camera and an EEG to monitor your symptoms and the electrical activity in your brain.  Blood tests.  Lumbar puncture. This test involves pulling fluid from your spine to check for infection.  Electrocardiogram (ECG or EKG). This test checks for an abnormal heart rhythm.  CT scan. If your health care provider thinks you have had a psychogenic non-epileptic seizure, you may need to see a mental health specialist for an  evaluation. How is this treated? The treatment for your seizures will depend on what is causing them. When the underlying condition is treated, your seizures should stop. If your seizures are being caused by emotional trauma or stress, your health care provider may recommend that you see a mental health professional. Treatment may include:  Relaxation therapy or cognitive behavioral therapy.  Medicines to treat depression or anxiety.  Individual or family counseling. In some cases, you may have psychogenic seizures in addition to epileptic seizures. If this is the case, you may be prescribed medicine to help with the epileptic seizures. Follow these instructions at home: Home care will depend on the type of non-epileptic seizures that you have. In general:  Follow all instructions from your health care provider. These may include ways to prevent seizures and what to do if you have a seizure.  Take over-the-counter and prescription medicines only as told by your health care provider.  Keep all follow-up visits as told by your health care provider. This is important.  Make sure family members, friends, and coworkers are trained on how to help you if you have a seizure. If you have a seizure, they should: ? Lay you on the ground to prevent a fall. ? Place a pillow or piece of clothing under your head. ? Loosen any clothing around your neck. ? Turn you onto your side. If vomiting occurs, this helps keep your airway clear.  Avoid any substances that may prevent your medicine from working properly. If you are prescribed medicine for seizures: ? Do not use recreational drugs. ? Limit or avoid alcoholic beverages. Contact a health care provider if:  Your seizures change or become more frequent.  You continue to have seizures after treatment. Get help right away if:  You injure yourself during a seizure.  You have one seizure after another.  You have trouble recovering from a  seizure.  You have chest pain or trouble breathing.  You have a seizure that lasts longer than 5 minutes. Summary  Non-epileptic seizures may look like epileptic seizures, but they are not caused by epilepsy.  The treatment for your seizures will depend on what is causing them. When the underlying condition is treated, your seizures should stop.  Make sure family members, friends, and coworkers are trained on how to help you if you have a seizure. If you have a seizure, they should lay you on the ground to prevent a fall, protect your head and neck, and turn you onto your side. This information is not intended to replace advice given to you by your health care provider. Make sure you discuss any questions you have with your health care provider. Document Revised: 03/01/2017 Document Reviewed: 12/30/2015 Elsevier Patient Education  2020 [employer].

## 2019-12-08 ENCOUNTER — Ambulatory Visit: Payer: BC Managed Care – PPO | Admitting: Occupational Therapy

## 2019-12-09 ENCOUNTER — Ambulatory Visit: Payer: BC Managed Care – PPO | Admitting: Occupational Therapy

## 2019-12-15 DIAGNOSIS — S069X9A Unspecified intracranial injury with loss of consciousness of unspecified duration, initial encounter: Secondary | ICD-10-CM | POA: Diagnosis not present

## 2019-12-15 DIAGNOSIS — R471 Dysarthria and anarthria: Secondary | ICD-10-CM | POA: Diagnosis not present

## 2019-12-15 DIAGNOSIS — G40A09 Absence epileptic syndrome, not intractable, without status epilepticus: Secondary | ICD-10-CM | POA: Diagnosis not present

## 2019-12-15 DIAGNOSIS — F411 Generalized anxiety disorder: Secondary | ICD-10-CM | POA: Diagnosis not present

## 2019-12-21 ENCOUNTER — Ambulatory Visit (INDEPENDENT_AMBULATORY_CARE_PROVIDER_SITE_OTHER): Payer: BC Managed Care – PPO | Admitting: Family Medicine

## 2019-12-21 ENCOUNTER — Encounter: Payer: Self-pay | Admitting: Family Medicine

## 2019-12-21 ENCOUNTER — Other Ambulatory Visit: Payer: Self-pay

## 2019-12-21 ENCOUNTER — Ambulatory Visit: Payer: Self-pay

## 2019-12-21 VITALS — BP 102/62 | HR 96 | Ht 67.0 in | Wt 183.2 lb

## 2019-12-21 DIAGNOSIS — M25562 Pain in left knee: Secondary | ICD-10-CM | POA: Diagnosis not present

## 2019-12-21 DIAGNOSIS — F329 Major depressive disorder, single episode, unspecified: Secondary | ICD-10-CM

## 2019-12-21 DIAGNOSIS — F0781 Postconcussional syndrome: Secondary | ICD-10-CM | POA: Diagnosis not present

## 2019-12-21 DIAGNOSIS — F419 Anxiety disorder, unspecified: Secondary | ICD-10-CM

## 2019-12-21 MED ORDER — DULOXETINE HCL 20 MG PO CPEP: ORAL_CAPSULE | ORAL | 0 refills | Status: DC

## 2019-12-21 NOTE — Patient Instructions (Addendum)
Thank you for coming in today.  We will switch from Wellbutrin and Lexapro to Cymbalta.  Tomorrow do no take wellbutrin but take the normal lexapro.  Wednesday take 1/2 pill of the lexapro and do not take Wellbutrin.  Thursday do not take Wellbutrin or lexapro and start 1 pill of Cymbalta.  Take 1 pill of cymbalta daily for 1 week then increase to 2 pills daily.  Often cylbalta will be a little sedating and most will take it at bedtime.   OK to take with buspar if needed.   You had a L knee injection today.  Call or go to the ER if you develop a large red swollen joint with extreme pain or oozing puss.    I do recommend that you schedule with psychiatry PA as offered.  Recheck in 3 weeks.  Could be phone if needed.    Call or go to the ER if you develop a large red swollen joint with extreme pain or oozing puss.
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
## 2019-12-28 ENCOUNTER — Encounter: Payer: Self-pay | Admitting: Family Medicine

## 2019-12-31 ENCOUNTER — Encounter: Payer: Self-pay | Admitting: *Deleted

## 2020-01-04 ENCOUNTER — Ambulatory Visit: Payer: BC Managed Care – PPO | Admitting: Physician Assistant

## 2020-01-04 ENCOUNTER — Ambulatory Visit (INDEPENDENT_AMBULATORY_CARE_PROVIDER_SITE_OTHER): Payer: BC Managed Care – PPO | Admitting: Diagnostic Neuroimaging

## 2020-01-04 ENCOUNTER — Ambulatory Visit: Payer: No Typology Code available for payment source | Admitting: Diagnostic Neuroimaging

## 2020-01-04 ENCOUNTER — Other Ambulatory Visit: Payer: Self-pay

## 2020-01-04 ENCOUNTER — Encounter: Payer: Self-pay | Admitting: Diagnostic Neuroimaging

## 2020-01-04 VITALS — BP 118/77 | HR 70 | Ht 67.0 in | Wt 183.0 lb

## 2020-01-04 DIAGNOSIS — F0781 Postconcussional syndrome: Secondary | ICD-10-CM
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
## 2020-01-05 DIAGNOSIS — G40A09 Absence epileptic syndrome, not intractable, without status epilepticus: Secondary | ICD-10-CM | POA: Diagnosis not present

## 2020-01-05 DIAGNOSIS — F331 Major depressive disorder, recurrent, moderate: Secondary | ICD-10-CM | POA: Diagnosis not present

## 2020-01-05 DIAGNOSIS — G4700 Insomnia, unspecified: Secondary | ICD-10-CM | POA: Diagnosis not present

## 2020-01-05 DIAGNOSIS — F411 Generalized anxiety disorder: Secondary | ICD-10-CM | POA: Diagnosis not present

## 2020-01-06 ENCOUNTER — Ambulatory Visit (INDEPENDENT_AMBULATORY_CARE_PROVIDER_SITE_OTHER): Payer: BC Managed Care – PPO | Admitting: Family Medicine

## 2020-01-06 ENCOUNTER — Other Ambulatory Visit: Payer: Self-pay

## 2020-01-06 ENCOUNTER — Encounter: Payer: Self-pay | Admitting: Family Medicine

## 2020-01-06 VITALS — BP 112/84 | HR 91 | Ht 67.0 in | Wt 180.2 lb

## 2020-01-06 DIAGNOSIS — S069X0D Unspecified intracranial injury without loss of consciousness, subsequent encounter: Secondary | ICD-10-CM | POA: Diagnosis not present

## 2020-01-06 DIAGNOSIS — F0781 Postconcussional syndrome: Secondary | ICD-10-CM

## 2020-01-06 DIAGNOSIS — M25562 Pain in left knee: Secondary | ICD-10-CM | POA: Diagnosis not present

## 2020-01-06 DIAGNOSIS — F445 Conversion disorder with seizures or convulsions: Secondary | ICD-10-CM | POA: Insufficient documentation

## 2020-01-06 DIAGNOSIS — G8929 Other chronic pain: Secondary | ICD-10-CM
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
## 2020-01-06 NOTE — Patient Instructions (Signed)
Thank you for coming in today.  Recheck in about 2 months. Special focus on your left knee

## 2020-01-11 ENCOUNTER — Other Ambulatory Visit: Payer: Self-pay | Admitting: Family Medicine

## 2020-01-14 ENCOUNTER — Encounter: Payer: Self-pay | Admitting: Family Medicine

## 2020-01-15 ENCOUNTER — Other Ambulatory Visit: Payer: Self-pay | Admitting: Family Medicine

## 2020-01-18 MED ORDER — ONDANSETRON 8 MG PO TBDP: 8.0000 mg | ORAL_TABLET | Freq: Three times a day (TID) | ORAL | 3 refills | Status: DC | PRN

## 2020-01-26 DIAGNOSIS — G4700 Insomnia, unspecified: Secondary | ICD-10-CM | POA: Diagnosis not present

## 2020-01-26 DIAGNOSIS — F331 Major depressive disorder, recurrent, moderate: Secondary | ICD-10-CM | POA: Diagnosis not present

## 2020-01-26 DIAGNOSIS — F411 Generalized anxiety disorder: Secondary | ICD-10-CM | POA: Diagnosis not present

## 2020-01-26 DIAGNOSIS — S069X9A Unspecified intracranial injury with loss of consciousness of unspecified duration, initial encounter: Secondary | ICD-10-CM | POA: Diagnosis not present

## 2020-01-27 MED ORDER — OMEPRAZOLE 40 MG PO CPDR: 40.0000 mg | DELAYED_RELEASE_CAPSULE | Freq: Every day | ORAL | 3 refills | Status: AC

## 2020-01-27 NOTE — Addendum Note (Signed)
Addended by: Rodolph Bong on: 01/27/2020 12:21 PM   Modules accepted: Orders

## 2020-01-28 ENCOUNTER — Ambulatory Visit (INDEPENDENT_AMBULATORY_CARE_PROVIDER_SITE_OTHER): Payer: BC Managed Care – PPO | Admitting: Physician Assistant

## 2020-01-28 ENCOUNTER — Encounter: Payer: Self-pay | Admitting: Physician Assistant

## 2020-01-28 ENCOUNTER — Other Ambulatory Visit: Payer: Self-pay

## 2020-01-28 VITALS — BP 122/70 | HR 81 | Ht 68.0 in | Wt 180.0 lb

## 2020-01-28 DIAGNOSIS — F4310 Post-traumatic stress disorder, unspecified: Secondary | ICD-10-CM | POA: Diagnosis not present

## 2020-01-28 DIAGNOSIS — F331 Major depressive disorder, recurrent, moderate: Secondary | ICD-10-CM | POA: Diagnosis not present

## 2020-01-28 DIAGNOSIS — F909 Attention-deficit hyperactivity disorder, unspecified type: Secondary | ICD-10-CM | POA: Diagnosis not present

## 2020-01-28 DIAGNOSIS — F0781 Postconcussional syndrome: Secondary | ICD-10-CM | POA: Diagnosis not present

## 2020-01-28 DIAGNOSIS — F445 Conversion disorder with seizures or convulsions: Secondary | ICD-10-CM

## 2020-01-28 DIAGNOSIS — S069X0S Unspecified intracranial injury without loss of consciousness, sequela: Secondary | ICD-10-CM

## 2020-01-28 MED ORDER — LAMOTRIGINE 25 MG PO TABS: ORAL_TABLET | ORAL | 1 refills | Status: DC

## 2020-01-28 NOTE — Patient Instructions (Addendum)
Wean off Wellbutrin 75 mg by taking 1/2 by mouth daily for 1 week and then stop.   Stay on Lexapro 10 mg daily.  Start Lamictal.  We discussed the rare potential for a rash to occur.  If you do get a rash at all, give our office a call.
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
## 2020-01-29 ENCOUNTER — Encounter: Payer: Self-pay | Admitting: Physician Assistant

## 2020-02-22 ENCOUNTER — Other Ambulatory Visit: Payer: Self-pay | Admitting: Family Medicine

## 2020-03-01 ENCOUNTER — Telehealth: Payer: Self-pay | Admitting: Physician Assistant

## 2020-03-01 ENCOUNTER — Telehealth (INDEPENDENT_AMBULATORY_CARE_PROVIDER_SITE_OTHER): Payer: BC Managed Care – PPO | Admitting: Physician Assistant

## 2020-03-01 ENCOUNTER — Encounter: Payer: Self-pay | Admitting: Physician Assistant

## 2020-03-01 DIAGNOSIS — F331 Major depressive disorder, recurrent, moderate: Secondary | ICD-10-CM | POA: Diagnosis not present

## 2020-03-01 DIAGNOSIS — S069X0S Unspecified intracranial injury without loss of consciousness, sequela: Secondary | ICD-10-CM

## 2020-03-01 DIAGNOSIS — F445 Conversion disorder with seizures or convulsions: Secondary | ICD-10-CM

## 2020-03-01 DIAGNOSIS — Z630 Problems in relationship with spouse or partner: Secondary | ICD-10-CM | POA: Diagnosis not present

## 2020-03-01 DIAGNOSIS — F909 Attention-deficit hyperactivity disorder, unspecified type: Secondary | ICD-10-CM

## 2020-03-01 MED ORDER — ESCITALOPRAM OXALATE 10 MG PO TABS: 10.0000 mg | ORAL_TABLET | Freq: Every day | ORAL | 1 refills | Status: DC

## 2020-03-01 MED ORDER — LAMOTRIGINE 100 MG PO TABS: 100.0000 mg | ORAL_TABLET | Freq: Every day | ORAL | 1 refills | Status: DC

## 2020-03-01 MED ORDER — TRAZODONE HCL 50 MG PO TABS
50.0000 mg | ORAL_TABLET | Freq: Every evening | ORAL | 1 refills | Status: DC | PRN
Start: 2020-03-01 — End: 2020-10-16

## 2020-03-01 MED ORDER — BUSPIRONE HCL 10 MG PO TABS: 10.0000 mg | ORAL_TABLET | Freq: Three times a day (TID) | ORAL | 5 refills | Status: DC | PRN

## 2020-03-01 NOTE — Telephone Encounter (Signed)
Mr. emauri, krygier are scheduled for a virtual visit with your provider today.    Just as we do with appointments in the office, we must obtain your consent to participate.  Your consent will be active for this visit and any virtual visit you may have with one of our providers in the next 365 days.    If you have a MyChart account, I can also send a copy of this consent to you electronically.  All virtual visits are billed to your insurance company just like a traditional visit in the office.  As this is a virtual visit, video technology does not allow for your provider to perform a traditional examination.  This may limit your provider's ability to fully assess your condition.  If your provider identifies any concerns that need to be evaluated in person or the need to arrange testing such as labs, EKG, etc, we will make arrangements to do so.    Although advances in technology are sophisticated, we cannot ensure that it will always work on either your end or our end.  If the connection with a video visit is poor, we may have to switch to a telephone visit.  With either a video or telephone visit, we are not always able to ensure that we have a secure connection.   I need to obtain your verbal consent now.   Are you willing to proceed with your visit today?   Nasif Bos has provided verbal consent on 03/01/2020 for a virtual visit (video or telephone).   Melony Overly, PA-C 03/01/2020  5:59 PM

## 2020-03-01 NOTE — Progress Notes (Signed)
[facility] Med Check  Patient ID: Jordan Robinson,  MRN: [id number]  PCP: Lewis Moccasin, MD  Date of Evaluation: 03/01/2020 Time spent:20 minutes  Chief Complaint:  Chief Complaint    Depression     Virtual Visit via Telehealth  I connected with patient by a video enabled telemedicine application with their informed consent, and verified patient privacy and that I am speaking with the correct person using two identifiers.  I am private, in my office and the patient is at home.  I discussed the limitations, risks, security and privacy concerns of performing an evaluation and management service by video and the availability of in person appointments. I also discussed with the patient that there may be a patient responsible charge related to this service. The patient expressed understanding and agreed to proceed.   I discussed the assessment and treatment plan with the patient. The patient was provided an opportunity to ask questions and all were answered. The patient agreed with the plan and demonstrated an understanding of the instructions.   The patient was advised to call back or seek an in-person evaluation if the symptoms worsen or if the condition fails to improve as anticipated.  I provided 20 minutes of non-face-to-face time during this encounter.  HISTORY/CURRENT STATUS: HPI for 1 month med check.  At his initial visit 1 month ago, we started Lamictal.  He has not been able to tell much improvement in his mood yet.  But it is not worse.  He has had no rash or other side effects with the Lamictal.  Since he was last here, he split up with his husband. He is now living in [place] with his sister. He plans to move back to [place] where he is from. States he is doing as well as expected under the circumstances.  He is still able to enjoy things and energy and motivation are good. Appetite is normal. Personal hygiene is normal. He does not cry easily. He is not isolating.  Not having a lot of anxiety at this point. States the Lexapro and the BuSpar have been helping that. Sleeps pretty well. Denies suicidal or homicidal thoughts.  Patient denies increased energy with decreased need for sleep, no increased talkativeness, no racing thoughts, no impulsivity or risky behaviors, no increased spending, no increased libido, no grandiosity, no increased irritability or anger, and no hallucinations.  Denies dizziness, syncope, seizures, numbness, tingling, tremor, tics, unsteady gait, slurred speech, confusion. Denies muscle or joint pain, stiffness, or dystonia.  Individual Medical History/ Review of Systems: Changes? :No    Past medications for mental health diagnoses include: Adderall, Wellbutrin, Buspar, Lexapro, Trazodone, Topamax, Cymbalta  Allergies: Soy allergy  Current Medications:  Current Outpatient Medications:  .  acetaminophen (TYLENOL) 500 MG tablet, Take 500 mg by mouth every 6 (six) hours as needed. , Disp: , Rfl:  .  busPIRone (BUSPAR) 10 MG tablet, Take 1 tablet (10 mg total) by mouth 3 (three) times daily as needed., Disp: 90 tablet, Rfl: 5 .  mometasone-formoterol (DULERA) 200-5 MCG/ACT AERO, Inhale 1 puff into the lungs daily., Disp: , Rfl:  .  omeprazole (PRILOSEC) 40 MG capsule, TAKE ONE CAPSULE BY MOUTH ONE TIME DAILY, Disp: 90 capsule, Rfl: 3 .  omeprazole (PRILOSEC) 40 MG capsule, Take 1 capsule (40 mg total) by mouth daily., Disp: 90 capsule, Rfl: 3 .  testosterone cypionate (DEPOTESTOSTERONE CYPIONATE) 200 MG/ML injection, Inject 100 mg into the skin once a week., Disp: , Rfl:  .  topiramate (TOPAMAX)  100 MG tablet, Take 1 tablet (100 mg total) by mouth 2 (two) times daily. For headache prevention, Disp: 60 tablet, Rfl: 2 .  traZODone (DESYREL) 50 MG tablet, Take 1-2 tablets (50-100 mg total) by mouth at bedtime as needed for sleep., Disp: 180 tablet, Rfl: 1 .  escitalopram (LEXAPRO) 10 MG tablet, Take 1 tablet (10 mg total) by mouth daily.,  Disp: 90 tablet, Rfl: 1 .  glucosamine-chondroitin 500-400 MG tablet, Take 1 tablet by mouth daily. (Patient not taking: Reported on 01/04/2020), Disp: , Rfl:  .  lamoTRIgine (LAMICTAL) 100 MG tablet, Take 1 tablet (100 mg total) by mouth daily., Disp: 30 tablet, Rfl: 1 .  ondansetron (ZOFRAN-ODT) 8 MG disintegrating tablet, Take 1 tablet (8 mg total) by mouth every 8 (eight) hours as needed for nausea. (Patient not taking: Reported on 01/28/2020), Disp: 20 tablet, Rfl: 3 Medication Side Effects: none  Family Medical/ Social History: Changes? He has moved in with his sister.  MENTAL HEALTH EXAM:  There were no vitals taken for this visit.There is no height or weight on file to calculate BMI.  General Appearance: Casual, Neat and Well Groomed  Eye Contact:  Good  Speech:  Clear and Coherent and Normal Rate  Volume:  Normal  Mood:  Euthymic  Affect:  Appropriate  Thought Process:  Goal Directed and Descriptions of Associations: Intact  Orientation:  Full (Time, Place, and Person)  Thought Content: Logical   Suicidal Thoughts:  No  Homicidal Thoughts:  No  Memory:  WNL  Judgement:  Good  Insight:  Good  Psychomotor Activity:  Normal  Concentration:  Concentration: Good  Recall:  Good  Fund of Knowledge: Good  Language: Good  Assets:  Desire for Improvement  ADL's:  Intact  Cognition: WNL  Prognosis:  Good    DIAGNOSES:    ICD-10-CM   1. Major depressive disorder, recurrent episode, moderate (HCC)  F33.1   2. Marital stress  Z63.0   3. Traumatic brain injury, without loss of consciousness, sequela (HCC)  S06.9X0S   4. 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 Attention deficit hyperactivity disorder (ADHD), unspecified ADHD type  F90.9     Receiving Psychotherapy: Yes With Lyndal Rainbow   RECOMMENDATIONS:  PDMP was reviewed. I provided 20 minutes of nonface-to-face time during this encounter. I am sorry about his marital separation. I recommend increasing the Lamictal. He has had  no rash or other side effects. He has been on it exactly 1 month so we will go ahead and increase. Increase Lamictal to 100 mg, 1 p.o. daily. Continue BuSpar 10 mg 1 p.o. 3 times daily. Continue Lexapro 10 mg, 1 p.o. daily. Continue trazodone 50 mg, 1-2 nightly as needed sleep. Continue treatment per neurology for postconcussive syndrome. Continue counseling with Lyndal Rainbow. Return in 4 to 6 weeks.  Melony Overly, PA-C

## 2020-03-03 ENCOUNTER — Encounter: Payer: Self-pay | Admitting: *Deleted

## 2020-03-03 ENCOUNTER — Telehealth: Payer: Self-pay | Admitting: *Deleted

## 2020-03-03 ENCOUNTER — Encounter: Payer: Self-pay | Admitting: Family Medicine

## 2020-03-03 NOTE — Telephone Encounter (Signed)
Letter sent via my chart

## 2020-03-03 NOTE — Telephone Encounter (Signed)
Pt called stating that [employer] was supposed to send [place] paperwork requesting a letter stating that it is okay for the pt to fly. The pt has a flight at 10am TODAY and will need this sent in mychart by 9am.

## 2020-03-04 NOTE — Progress Notes (Signed)
Jordan Robinson  Was scheduled today for a phone visit.  I called him at the agreed-upon time 3 times and left 3 voicemails.  He did not call back.  Considered a no-show.

## 2020-03-07 ENCOUNTER — Other Ambulatory Visit: Payer: Self-pay

## 2020-03-07 ENCOUNTER — Ambulatory Visit: Payer: BC Managed Care – PPO | Admitting: Family Medicine

## 2020-03-07 DIAGNOSIS — Z91199 Patient's noncompliance with other medical treatment and regimen due to unspecified reason: Secondary | ICD-10-CM

## 2020-03-07 DIAGNOSIS — Z5329 Procedure and treatment not carried out because of patient's decision for other reasons: Secondary | ICD-10-CM

## 2020-03-10 ENCOUNTER — Encounter: Payer: Self-pay | Admitting: Occupational Therapy

## 2020-03-10 NOTE — Therapy (Signed)
[facility] [address] [place], [place], [address] Phone: [phone]   Fax:  [phone]  Patient Details  Name: Jordan Robinson MRN: [id number] Date of Birth: [birth year] Referring Provider:  No ref. provider found  Encounter Date: 03/10/2020   OCCUPATIONAL THERAPY DISCHARGE SUMMARY  Visits from Start of Care: 13  Current functional level related to goals / functional outcomes:  OT Short Term Goals - 11/26/19 0946      OT SHORT TERM GOAL #1   Title Pt will be independent with initial HEP.--check STGs 11/11/19    Time 4    Period Weeks    Status Achieved      OT SHORT TERM GOAL #2   Title Pt will demo at least 90* L shoulder flex for functional reaching.    Baseline 70*    Time 4    Period Weeks    Status Achieved   10/26/19:  met 110* without pain today     OT SHORT TERM GOAL #3   Title Pt will report pain in LUE consistently less than or equal to 3/10 for light ADLs/IADLs    Time 4    Period Weeks    Status Not met   fluctuates 2-5/10     OT SHORT TERM GOAL #4   Title Pt will verbalize understanding of adaptive strategies to incr participation in IADLs and decr symtoms.    Time 8    Period Weeks    Status Achieved      OT SHORT TERM GOAL #5   Title Pt will verbalize understanding of memory/cogntive compensation strategies.    Time 4    Period Weeks    Status Achieved      OT SHORT TERM GOAL #6   Title Pt will verbalize understanding of proper positioning of LUE to decr pain.    Time 8    Period Weeks    Status Achieved            OT Long Term Goals - 11/26/19 0946      OT LONG TERM GOAL #1   Title Pt will be independent with updated HEP.--check LTGs 12/11/19    Time 8    Period Weeks    Status Not fully met     OT LONG TERM GOAL #2   Title Pt will demo at least 110* L shoulder flex for light functional reaching.  Updated 8/26 to 120*    Time 8    Period Weeks    Status Not met Revised  goal (met initial goal)   11/26/19:  115*     OT LONG TERM GOAL #3   Title Pt will report pain less than or equal to 2/10 consistently for light ADLs/IADLs.    Time 8    Period Weeks    Status Not met.     OT LONG TERM GOAL #4   Title Pt will improve L grip strength by at least 15lbs to assist with opening containers.    Baseline 18.9lbs    Time 8    Period Weeks    Status Not met   11/26/19:  17lbs     OT LONG TERM GOAL #5   Title Pt will be able to perform at least 1 light meal prep task and at least 1 home maintenance task per day consistently.    Time 8    Period Weeks    Status Achieved   11/26/19  Remaining deficits: Pain, decr L shoulder ROM, decr strength   Education / Equipment: Pt instructed in HEP, positioning of LUE, and cognitive/memory compensation strategies. Pt verbalized understanding of education provided.  Plan: Patient agrees to discharge.  Patient goals were partially met. Patient is being discharged due to a change in medical status. (requested hold therapy due to medical change, but did not return in >30 days. ?????       FREEMAN,ANGELA 03/10/2020, 9:53 AM  [facility] [address] [place], [place], [address] Phone: [phone]   Fax:  [phone]   Angela Freeman, OTR/L [facility] [address] [place], [place]  [address] [phone] phone [phone] 03/10/20 9:53 AM    

## 2020-03-22 ENCOUNTER — Other Ambulatory Visit: Payer: Self-pay

## 2020-03-22 ENCOUNTER — Ambulatory Visit: Payer: BC Managed Care – PPO | Admitting: Family Medicine

## 2020-03-22 NOTE — Progress Notes (Signed)
Patient was scheduled today for virtual phone visit. I called him twice and left messages. No response.

## 2020-03-29 ENCOUNTER — Ambulatory Visit (INDEPENDENT_AMBULATORY_CARE_PROVIDER_SITE_OTHER): Payer: BC Managed Care – PPO | Admitting: Family Medicine

## 2020-03-29 ENCOUNTER — Other Ambulatory Visit: Payer: Self-pay

## 2020-03-29 DIAGNOSIS — F0781 Postconcussional syndrome: Secondary | ICD-10-CM | POA: Diagnosis not present

## 2020-03-29 DIAGNOSIS — S069X0D Unspecified intracranial injury without loss of consciousness, subsequent encounter: Secondary | ICD-10-CM

## 2020-03-29 DIAGNOSIS — F445 Conversion disorder with seizures or convulsions: Secondary | ICD-10-CM | POA: Diagnosis not present
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
## 2020-04-05 DIAGNOSIS — R404 Transient alteration of awareness: Secondary | ICD-10-CM | POA: Diagnosis not present

## 2020-04-05 DIAGNOSIS — R569 Unspecified convulsions: Secondary | ICD-10-CM | POA: Diagnosis not present

## 2020-04-05 DIAGNOSIS — Z87820 Personal history of traumatic brain injury: Secondary | ICD-10-CM | POA: Diagnosis not present

## 2020-04-05 DIAGNOSIS — R531 Weakness: Secondary | ICD-10-CM | POA: Diagnosis not present

## 2020-04-05 DIAGNOSIS — I959 Hypotension, unspecified: Secondary | ICD-10-CM | POA: Diagnosis not present

## 2020-04-15 ENCOUNTER — Encounter: Payer: Self-pay | Admitting: Physician Assistant

## 2020-04-15 ENCOUNTER — Telehealth (INDEPENDENT_AMBULATORY_CARE_PROVIDER_SITE_OTHER): Payer: BC Managed Care – PPO | Admitting: Physician Assistant

## 2020-04-15 DIAGNOSIS — F445 Conversion disorder with seizures or convulsions: Secondary | ICD-10-CM | POA: Diagnosis not present

## 2020-04-15 DIAGNOSIS — F331 Major depressive disorder, recurrent, moderate: Secondary | ICD-10-CM

## 2020-04-15 DIAGNOSIS — S069X0S Unspecified intracranial injury without loss of consciousness, sequela: Secondary | ICD-10-CM

## 2020-04-15 DIAGNOSIS — F4310 Post-traumatic stress disorder, unspecified: Secondary | ICD-10-CM | POA: Diagnosis not present

## 2020-04-15 DIAGNOSIS — F0781 Postconcussional syndrome: Secondary | ICD-10-CM

## 2020-04-15 DIAGNOSIS — F909 Attention-deficit hyperactivity disorder, unspecified type: Secondary | ICD-10-CM | POA: Diagnosis not present

## 2020-04-15 DIAGNOSIS — Z630 Problems in relationship with spouse or partner: Secondary | ICD-10-CM

## 2020-04-15 MED ORDER — AMPHETAMINE-DEXTROAMPHET ER 5 MG PO CP24: 5.0000 mg | ORAL_CAPSULE | Freq: Every day | ORAL | 0 refills | Status: DC

## 2020-04-15 MED ORDER — LAMOTRIGINE 100 MG PO TABS: 100.0000 mg | ORAL_TABLET | Freq: Every day | ORAL | 1 refills | Status: DC

## 2020-04-15 MED ORDER — ESCITALOPRAM OXALATE 20 MG PO TABS: 20.0000 mg | ORAL_TABLET | Freq: Every day | ORAL | 1 refills | Status: DC

## 2020-04-15 NOTE — Progress Notes (Signed)
[facility] Med Check  Patient ID: Jordan Robinson,  MRN: [id number]  PCP: Lewis Moccasin, MD  Date of Evaluation: 04/15/2020 Time spent:40 minutes  Chief Complaint:  Chief Complaint    Depression; Insomnia     Virtual Visit via Telehealth  I connected with patient by a video enabled telemedicine application with their informed consent, and verified patient privacy and that I am speaking with the correct person using two identifiers.  I am private, in my office and the patient is at home.  I discussed the limitations, risks, security and privacy concerns of performing an evaluation and management service by video and the availability of in person appointments. I also discussed with the patient that there may be a patient responsible charge related to this service. The patient expressed understanding and agreed to proceed.   I discussed the assessment and treatment plan with the patient. The patient was provided an opportunity to ask questions and all were answered. The patient agreed with the plan and demonstrated an understanding of the instructions.   The patient was advised to call back or seek an in-person evaluation if the symptoms worsen or if the condition fails to improve as anticipated.  I provided 40 minutes of non-face-to-face time during this encounter.  HISTORY/CURRENT STATUS: HPI for 6 week med check.  States he has a lot of chaos in his mind that is literally giving him headaches. Has taken Adderall beginning in 2016.  It really helped at the time.  Was prescribed per his PCP as recent as March 2021.  Wonders if he can go back on it.  He feels like he cannot get anything done because she cannot stay focused and his mind wanders all over the place.  He is not working at the present time.  States that he can't due to history of traumatic brain injury.  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   Since his last visit, he participated in brain boot camp at [facility] in [place] which has helped but was very intense.  Had to go to the emergency room 04/05/2020 due to seizure.  Labs were nl. Didn't require admission.   Complains that his liver hurts. He knows that's what it is. No jaundice, n/v/c/d, no dark urine.   Still separated from his husband. Having trouble enjoying things, energy and motivation are low. He does not cry easily. He is not isolating. But feels that the Lexapro isn't working as well as it did, and is a little more anxious. Sleeps pretty well. Denies suicidal or homicidal thoughts.  Patient denies increased energy with decreased need for sleep, no increased talkativeness, no racing thoughts, no impulsivity or risky behaviors, no increased spending, no increased libido, no grandiosity, no increased irritability or anger, and no hallucinations.  Denies dizziness, syncope, seizures, numbness, tingling, tremor, tics, unsteady gait, slurred speech, confusion. Denies muscle or joint pain, stiffness, or dystonia.  Individual Medical History/ Review of Systems: Changes? :No    Past medications for mental health diagnoses include: Adderall, Wellbutrin, Buspar, Lexapro, Trazodone, Topamax, Cymbalta  Allergies: Soy allergy  Current Medications:  Current Outpatient Medications:  .  acetaminophen (TYLENOL) 500 MG tablet, Take 500 mg by mouth every 6 (six) hours as needed. , Disp: , Rfl:  .  amphetamine-dextroamphetamine (ADDERALL XR) 5 MG 24 hr capsule, Take 1 capsule (5 mg total) by mouth daily., Disp: 30 capsule,  Rfl: 0 .  busPIRone (BUSPAR) 10 MG tablet, Take 1 tablet (10 mg total) by mouth 3 (three) times daily as needed., Disp: 90 tablet, Rfl: 5 .  escitalopram (LEXAPRO) 20 MG tablet, Take 1 tablet (20 mg total) by mouth daily., Disp: 30 tablet, Rfl: 1 .  mometasone-formoterol (DULERA) 200-5 MCG/ACT AERO, Inhale 1 puff into the lungs daily., Disp: , Rfl:  .   omeprazole (PRILOSEC) 40 MG capsule, Take 1 capsule (40 mg total) by mouth daily., Disp: 90 capsule, Rfl: 3 .  testosterone cypionate (DEPOTESTOSTERONE CYPIONATE) 200 MG/ML injection, Inject 100 mg into the skin once a week., Disp: , Rfl:  .  traZODone (DESYREL) 50 MG tablet, Take 1-2 tablets (50-100 mg total) by mouth at bedtime as needed for sleep., Disp: 180 tablet, Rfl: 1 .  glucosamine-chondroitin 500-400 MG tablet, Take 1 tablet by mouth daily. (Patient not taking: No sig reported), Disp: , Rfl:  .  lamoTRIgine (LAMICTAL) 100 MG tablet, Take 1 tablet (100 mg total) by mouth daily., Disp: 30 tablet, Rfl: 1 Medication Side Effects: none  Family Medical/ Social History: Changes? No  MENTAL HEALTH EXAM:  There were no vitals taken for this visit.There is no height or weight on file to calculate BMI.  General Appearance: Casual, Neat and Well Groomed  Eye Contact:  Good  Speech:  Clear and Coherent and Normal Rate  Volume:  Normal  Mood:  Euthymic  Affect:  Appropriate  Thought Process:  Goal Directed and Descriptions of Associations: Intact  Orientation:  Full (Time, Place, and Person)  Thought Content: Logical   Suicidal Thoughts:  No  Homicidal Thoughts:  No  Memory:  WNL  Judgement:  Good  Insight:  Good  Psychomotor Activity:  Normal  Concentration:  Concentration: Good  Recall:  Good  Fund of Knowledge: Good  Language: Good  Assets:  Desire for Improvement  ADL's:  Intact  Cognition: WNL  Prognosis:  Good   Labs in the ER 04/05/2020 CBC was normal. Magnesium normal at 2.0 TSH 3.88 CMP completely normal, specifically noted normal LFTs.  DIAGNOSES:    ICD-10-CM   1. Major depressive disorder, recurrent episode, moderate (HCC)  F33.1   2. Attention deficit hyperactivity disorder (ADHD), unspecified ADHD type  F90.9   3. PTSD (post-traumatic stress disorder)  F43.10   4. Traumatic brain injury, without loss of consciousness, sequela (HCC)  S06.9X0S   5. 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 Postconcussion syndrome  F07.81   7. Marital stress  Z63.0     Receiving Psychotherapy: Yes With Lyndal Rainbow   RECOMMENDATIONS:  PDMP was reviewed. I provided 40 minutes of nonface-to-face time during this encounter, discussing the ER visit including my review of note and lab results.  Recommend restarting Adderall, with low dose. Benefits, risks, SE discussed, including addictive potential, and he accepts. Start Adderall XR 5 mg, 1 q am.  Continue Lamictal 100 mg, 1 p.o. daily. Continue BuSpar 10 mg 1 p.o. 3 times daily. Increase Lexapro to 20 mg, 1 p.o. daily. Continue trazodone 50 mg, 1-2 nightly as needed sleep. Continue treatment per neurology for postconcussive syndrome. Continue counseling with Lyndal Rainbow. Return in 6 weeks.  Melony Overly, PA-C

## 2020-05-11 ENCOUNTER — Telehealth: Payer: Self-pay | Admitting: Family Medicine

## 2020-05-11 NOTE — Telephone Encounter (Signed)
Called pt and he states that this indented area on the back of his head is something that he recently found but states that there is no new injury or no new symptoms associated w/ this area.  Pt is advised that based on that info, watchful waiting is the best course of action.  Inform pt that if this area on the back of his head changes or if he develops new symptoms, he is to call us back.
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
## 2020-05-11 NOTE — Telephone Encounter (Signed)
I am happy to see Jordan Robinson if he needs to be seen but I think we can watch this for a while.

## 2020-06-19 DIAGNOSIS — M79605 Pain in left leg: Secondary | ICD-10-CM | POA: Diagnosis not present

## 2020-06-19 DIAGNOSIS — S7012XA Contusion of left thigh, initial encounter: Secondary | ICD-10-CM | POA: Diagnosis not present

## 2020-06-19 DIAGNOSIS — M25552 Pain in left hip: Secondary | ICD-10-CM | POA: Diagnosis not present

## 2020-06-20 ENCOUNTER — Encounter: Payer: Self-pay | Admitting: Family Medicine

## 2020-06-24 ENCOUNTER — Encounter: Payer: Self-pay | Admitting: Family Medicine

## 2020-06-24 NOTE — Progress Notes (Signed)
 [  date]  Name of Professional (therapist, physician, psychiatrist, rehabilitation counselor) [address] [place], [place] Zip  Dear Selena Lesser Authority/Landlord]:  [Full Name of Tenant] is my patient, and has been under my care since [date]. I am intimately familiar with his/her history and with the functional limitations imposed by his/her disability. He/She meets the definition of disability under the Americans with Disabilities Act, the [employer], and the Rehabilitation Act of 1973.  Due to mental illness, [first name] has certain limitations regarding [social interaction/coping with stress/anxiety, etc.]. In order to help alleviate these difficulties, and to enhance his/her ability to live independently and to fully use and enjoy the dwelling unit you own and/or administer, I am prescribing an emotional support animal that will assist [first name] in coping with his/her disability.  I am familiar with the voluminous professional literature concerning the therapeutic benefits of assistance animals for people with disabilities such as that experienced by [facility] name]. Upon request, I will share citations to relevant studies, and would be happy to answer other questions you may have concerning my recommendation that [Full Name of Tenant] have an emotional support animalShould you have additional questions, please do not hesitate to contact me.  Sincerely,  Name of Professional

## 2020-06-29 DIAGNOSIS — H539 Unspecified visual disturbance: Secondary | ICD-10-CM | POA: Diagnosis not present

## 2020-06-29 DIAGNOSIS — H52223 Regular astigmatism, bilateral: Secondary | ICD-10-CM | POA: Diagnosis not present

## 2020-06-29 DIAGNOSIS — Z87820 Personal history of traumatic brain injury: Secondary | ICD-10-CM | POA: Diagnosis not present

## 2020-06-29 DIAGNOSIS — H5330 Unspecified disorder of binocular vision: Secondary | ICD-10-CM | POA: Diagnosis not present

## 2020-06-29 DIAGNOSIS — H5589 Other irregular eye movements: Secondary | ICD-10-CM | POA: Diagnosis not present

## 2020-06-29 DIAGNOSIS — H5581 Saccadic eye movements: Secondary | ICD-10-CM | POA: Diagnosis not present

## 2020-06-29 DIAGNOSIS — H5203 Hypermetropia, bilateral: Secondary | ICD-10-CM | POA: Diagnosis not present

## 2020-07-06 DIAGNOSIS — R94112 Abnormal visually evoked potential [VEP]: Secondary | ICD-10-CM | POA: Diagnosis not present

## 2020-07-07 DIAGNOSIS — H539 Unspecified visual disturbance: Secondary | ICD-10-CM | POA: Diagnosis not present

## 2020-07-07 DIAGNOSIS — H5581 Saccadic eye movements: Secondary | ICD-10-CM | POA: Diagnosis not present

## 2020-07-07 DIAGNOSIS — H5589 Other irregular eye movements: Secondary | ICD-10-CM | POA: Diagnosis not present

## 2020-07-07 DIAGNOSIS — H5330 Unspecified disorder of binocular vision: Secondary | ICD-10-CM | POA: Diagnosis not present

## 2020-07-12 DIAGNOSIS — Z79899 Other long term (current) drug therapy: Secondary | ICD-10-CM | POA: Diagnosis not present

## 2020-07-12 DIAGNOSIS — F649 Gender identity disorder, unspecified: Secondary | ICD-10-CM | POA: Diagnosis not present

## 2020-07-20 ENCOUNTER — Other Ambulatory Visit: Payer: Self-pay | Admitting: Physician Assistant

## 2020-08-09 ENCOUNTER — Telehealth: Payer: Self-pay | Admitting: Physician Assistant

## 2020-08-09 ENCOUNTER — Other Ambulatory Visit: Payer: Self-pay

## 2020-08-09 MED ORDER — AMPHETAMINE-DEXTROAMPHET ER 5 MG PO CP24: 5.0000 mg | ORAL_CAPSULE | Freq: Every day | ORAL | 0 refills | Status: DC

## 2020-08-09 NOTE — Telephone Encounter (Signed)
[facility] called to request refill of his Adderall.  Made appt 10/04/20.  Send to [facility] on [address] [place], [place]

## 2020-08-09 NOTE — Telephone Encounter (Signed)
Last filled 04/15/20,pended.One was pended for [facility] on [address] but I pended another one for [facility] on [address] in [place]

## 2020-08-30 ENCOUNTER — Other Ambulatory Visit: Payer: Self-pay | Admitting: Physician Assistant

## 2020-09-05 ENCOUNTER — Other Ambulatory Visit: Payer: Self-pay | Admitting: Physician Assistant

## 2020-10-04 ENCOUNTER — Ambulatory Visit: Payer: BC Managed Care – PPO | Admitting: Physician Assistant

## 2020-10-04 ENCOUNTER — Other Ambulatory Visit: Payer: Self-pay

## 2020-10-04 ENCOUNTER — Telehealth: Payer: Self-pay | Admitting: Physician Assistant

## 2020-10-04 MED ORDER — ESCITALOPRAM OXALATE 20 MG PO TABS: 20.0000 mg | ORAL_TABLET | Freq: Every day | ORAL | 0 refills | Status: DC

## 2020-10-04 NOTE — Telephone Encounter (Signed)
Pt requesting to increase Adderall  XR 5 mg to next dose. No longer working. Also, need refill for Lexapro 20 mg  generic @ [facility]. Had apt today. Provider RS.Contact # [phone]

## 2020-10-04 NOTE — Telephone Encounter (Signed)
Lexapro sent.Pt stated he thinks the dose needs to be increased for adderall and can wait until you return to discuss

## 2020-10-05 NOTE — Telephone Encounter (Signed)
Reviewed

## 2020-10-06 ENCOUNTER — Telehealth: Payer: Self-pay | Admitting: Physician Assistant

## 2020-10-06 ENCOUNTER — Other Ambulatory Visit: Payer: Self-pay

## 2020-10-06 MED ORDER — ESCITALOPRAM OXALATE 20 MG PO TABS: 20.0000 mg | ORAL_TABLET | Freq: Every day | ORAL | 0 refills | Status: DC

## 2020-10-06 NOTE — Telephone Encounter (Signed)
Patient lm requesting Lexapro sent to the [facility] [address] in [place] on 7/5. Pt stated it was sent to [facility] location after office confirmed it would be sent to [facility]. Notification is requested after refill is corrected.

## 2020-10-06 NOTE — Telephone Encounter (Signed)
Rx sent and pt informed  

## 2020-10-07 ENCOUNTER — Other Ambulatory Visit: Payer: Self-pay | Admitting: Physician Assistant

## 2020-10-10 ENCOUNTER — Other Ambulatory Visit: Payer: Self-pay | Admitting: Physician Assistant

## 2020-10-10 MED ORDER — AMPHETAMINE-DEXTROAMPHET ER 10 MG PO CP24: 10.0000 mg | ORAL_CAPSULE | Freq: Every day | ORAL | 0 refills | Status: DC

## 2020-10-10 NOTE — Telephone Encounter (Signed)
Pt informed

## 2020-10-10 NOTE — Telephone Encounter (Signed)
Please let him know I sent in the Adderall XR 10 mg.

## 2020-10-11 DIAGNOSIS — F640 Transsexualism: Secondary | ICD-10-CM | POA: Diagnosis not present

## 2020-10-13 ENCOUNTER — Other Ambulatory Visit: Payer: Self-pay | Admitting: Physician Assistant

## 2020-10-18 ENCOUNTER — Encounter: Payer: Self-pay | Admitting: Family Medicine

## 2020-10-20 ENCOUNTER — Other Ambulatory Visit: Payer: Self-pay | Admitting: Physician Assistant

## 2020-11-01 ENCOUNTER — Encounter: Payer: Self-pay | Admitting: Physician Assistant

## 2020-11-01 ENCOUNTER — Encounter: Payer: Self-pay | Admitting: Family Medicine

## 2020-11-01 ENCOUNTER — Telehealth (INDEPENDENT_AMBULATORY_CARE_PROVIDER_SITE_OTHER): Payer: BC Managed Care – PPO | Admitting: Physician Assistant

## 2020-11-01 DIAGNOSIS — F5105 Insomnia due to other mental disorder: Secondary | ICD-10-CM

## 2020-11-01 DIAGNOSIS — F445 Conversion disorder with seizures or convulsions: Secondary | ICD-10-CM

## 2020-11-01 DIAGNOSIS — S069X0S Unspecified intracranial injury without loss of consciousness, sequela: Secondary | ICD-10-CM

## 2020-11-01 DIAGNOSIS — F99 Mental disorder, not otherwise specified: Secondary | ICD-10-CM

## 2020-11-01 DIAGNOSIS — F909 Attention-deficit hyperactivity disorder, unspecified type: Secondary | ICD-10-CM

## 2020-11-01 DIAGNOSIS — F331 Major depressive disorder, recurrent, moderate: Secondary | ICD-10-CM

## 2020-11-01 MED ORDER — ESCITALOPRAM OXALATE 20 MG PO TABS: 20.0000 mg | ORAL_TABLET | Freq: Every day | ORAL | 0 refills | Status: DC

## 2020-11-01 MED ORDER — LAMOTRIGINE 100 MG PO TABS: 100.0000 mg | ORAL_TABLET | Freq: Every day | ORAL | 5 refills | Status: DC

## 2020-11-01 MED ORDER — BUSPIRONE HCL 10 MG PO TABS: 10.0000 mg | ORAL_TABLET | Freq: Three times a day (TID) | ORAL | 5 refills | Status: DC | PRN

## 2020-11-01 MED ORDER — AMPHETAMINE-DEXTROAMPHET ER 15 MG PO CP24: 15.0000 mg | ORAL_CAPSULE | ORAL | 0 refills | Status: DC
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
## 2020-11-03 ENCOUNTER — Encounter: Payer: Self-pay | Admitting: Family Medicine

## 2020-11-16 DIAGNOSIS — G40909 Epilepsy, unspecified, not intractable, without status epilepticus: Secondary | ICD-10-CM | POA: Diagnosis not present

## 2020-11-16 DIAGNOSIS — R1031 Right lower quadrant pain: Secondary | ICD-10-CM | POA: Diagnosis not present

## 2020-11-16 DIAGNOSIS — Z79899 Other long term (current) drug therapy: Secondary | ICD-10-CM | POA: Diagnosis not present

## 2020-11-16 DIAGNOSIS — R110 Nausea: Secondary | ICD-10-CM | POA: Diagnosis not present

## 2020-11-22 DIAGNOSIS — M9905 Segmental and somatic dysfunction of pelvic region: Secondary | ICD-10-CM | POA: Diagnosis not present

## 2020-11-22 DIAGNOSIS — M9902 Segmental and somatic dysfunction of thoracic region: Secondary | ICD-10-CM | POA: Diagnosis not present

## 2020-11-22 DIAGNOSIS — M9903 Segmental and somatic dysfunction of lumbar region: Secondary | ICD-10-CM | POA: Diagnosis not present

## 2020-11-29 DIAGNOSIS — M9903 Segmental and somatic dysfunction of lumbar region: Secondary | ICD-10-CM | POA: Diagnosis not present

## 2020-11-29 DIAGNOSIS — M9905 Segmental and somatic dysfunction of pelvic region: Secondary | ICD-10-CM | POA: Diagnosis not present

## 2020-11-29 DIAGNOSIS — M9902 Segmental and somatic dysfunction of thoracic region: Secondary | ICD-10-CM | POA: Diagnosis not present

## 2020-12-06 DIAGNOSIS — M9905 Segmental and somatic dysfunction of pelvic region: Secondary | ICD-10-CM | POA: Diagnosis not present

## 2020-12-06 DIAGNOSIS — M9903 Segmental and somatic dysfunction of lumbar region: Secondary | ICD-10-CM | POA: Diagnosis not present

## 2020-12-06 DIAGNOSIS — M9902 Segmental and somatic dysfunction of thoracic region: Secondary | ICD-10-CM | POA: Diagnosis not present

## 2020-12-13 DIAGNOSIS — M9903 Segmental and somatic dysfunction of lumbar region: Secondary | ICD-10-CM | POA: Diagnosis not present

## 2020-12-13 DIAGNOSIS — M9902 Segmental and somatic dysfunction of thoracic region: Secondary | ICD-10-CM | POA: Diagnosis not present

## 2020-12-13 DIAGNOSIS — M9905 Segmental and somatic dysfunction of pelvic region: Secondary | ICD-10-CM | POA: Diagnosis not present

## 2020-12-27 DIAGNOSIS — M9905 Segmental and somatic dysfunction of pelvic region: Secondary | ICD-10-CM | POA: Diagnosis not present

## 2020-12-27 DIAGNOSIS — M9903 Segmental and somatic dysfunction of lumbar region: Secondary | ICD-10-CM | POA: Diagnosis not present

## 2020-12-27 DIAGNOSIS — M9902 Segmental and somatic dysfunction of thoracic region: Secondary | ICD-10-CM | POA: Diagnosis not present

## 2021-01-05 ENCOUNTER — Other Ambulatory Visit: Payer: Self-pay

## 2021-01-05 ENCOUNTER — Telehealth: Payer: Self-pay | Admitting: Physician Assistant

## 2021-01-05 MED ORDER — AMPHETAMINE-DEXTROAMPHET ER 15 MG PO CP24: 15.0000 mg | ORAL_CAPSULE | ORAL | 0 refills | Status: DC

## 2021-01-05 NOTE — Telephone Encounter (Signed)
Jordan Robinson called requesting a refill on the Adderall. Patient is not currently out, but will be before scheduled appt on 11/8. Fill at the [facility] # [address], [place] - [address]  [address] [place] [address]  Phone:  [phone]  Fax:  [phone] . Patient request to be notified if it's not available at that location when Rx is due. [phone].

## 2021-01-05 NOTE — Telephone Encounter (Signed)
pended

## 2021-01-10 DIAGNOSIS — M9905 Segmental and somatic dysfunction of pelvic region: Secondary | ICD-10-CM | POA: Diagnosis not present

## 2021-01-10 DIAGNOSIS — M9903 Segmental and somatic dysfunction of lumbar region: Secondary | ICD-10-CM | POA: Diagnosis not present

## 2021-01-10 DIAGNOSIS — M9902 Segmental and somatic dysfunction of thoracic region: Secondary | ICD-10-CM | POA: Diagnosis not present

## 2021-01-13 DIAGNOSIS — R509 Fever, unspecified: Secondary | ICD-10-CM | POA: Diagnosis not present

## 2021-01-13 DIAGNOSIS — R519 Headache, unspecified: Secondary | ICD-10-CM | POA: Diagnosis not present

## 2021-01-13 DIAGNOSIS — J029 Acute pharyngitis, unspecified: Secondary | ICD-10-CM | POA: Diagnosis not present

## 2021-01-13 DIAGNOSIS — R42 Dizziness and giddiness: Secondary | ICD-10-CM | POA: Diagnosis not present

## 2021-01-17 DIAGNOSIS — H52223 Regular astigmatism, bilateral: Secondary | ICD-10-CM | POA: Diagnosis not present

## 2021-01-17 DIAGNOSIS — H5581 Saccadic eye movements: Secondary | ICD-10-CM | POA: Diagnosis not present

## 2021-01-17 DIAGNOSIS — H5330 Unspecified disorder of binocular vision: Secondary | ICD-10-CM | POA: Diagnosis not present

## 2021-01-17 DIAGNOSIS — H539 Unspecified visual disturbance: Secondary | ICD-10-CM | POA: Diagnosis not present

## 2021-01-17 DIAGNOSIS — H5203 Hypermetropia, bilateral: Secondary | ICD-10-CM | POA: Diagnosis not present

## 2021-01-17 DIAGNOSIS — R94112 Abnormal visually evoked potential [VEP]: Secondary | ICD-10-CM | POA: Diagnosis not present

## 2021-01-17 DIAGNOSIS — H53483 Generalized contraction of visual field, bilateral: Secondary | ICD-10-CM | POA: Diagnosis not present

## 2021-02-07 ENCOUNTER — Telehealth (INDEPENDENT_AMBULATORY_CARE_PROVIDER_SITE_OTHER): Payer: BC Managed Care – PPO | Admitting: Physician Assistant

## 2021-02-07 ENCOUNTER — Encounter: Payer: Self-pay | Admitting: Physician Assistant

## 2021-02-07 DIAGNOSIS — F5105 Insomnia due to other mental disorder: Secondary | ICD-10-CM

## 2021-02-07 DIAGNOSIS — F909 Attention-deficit hyperactivity disorder, unspecified type: Secondary | ICD-10-CM

## 2021-02-07 DIAGNOSIS — F99 Mental disorder, not otherwise specified: Secondary | ICD-10-CM

## 2021-02-07 DIAGNOSIS — F411 Generalized anxiety disorder: Secondary | ICD-10-CM

## 2021-02-07 DIAGNOSIS — F3341 Major depressive disorder, recurrent, in partial remission: Secondary | ICD-10-CM

## 2021-02-07 MED ORDER — ESCITALOPRAM OXALATE 20 MG PO TABS: 20.0000 mg | ORAL_TABLET | Freq: Every day | ORAL | 0 refills | Status: DC

## 2021-02-07 MED ORDER — BUSPIRONE HCL 15 MG PO TABS: 15.0000 mg | ORAL_TABLET | Freq: Three times a day (TID) | ORAL | 1 refills | Status: DC

## 2021-02-07 NOTE — Progress Notes (Signed)
[facility]  Patient ID: Jordan Robinson,  MRN: [id number]  PCP: Lewis Moccasin, MD  Date of Evaluation: 02/07/2021  time spent:30 minutes  Chief Complaint:  Chief Complaint   Anxiety; Follow-up     Virtual Visit via Telehealth  I connected with patient by a video enabled telemedicine application with their informed consent, and verified patient privacy and that I am speaking with the correct person using two identifiers.  I am private, in my office and the patient is at home.  I discussed the limitations, risks, security and privacy concerns of performing an evaluation and management service by video and the availability of in person appointments. I also discussed with the patient that there may be a patient responsible charge related to this service. The patient expressed understanding and agreed to proceed.   I discussed the assessment and treatment plan with the patient. The patient was provided an opportunity to ask questions and all were answered. The patient agreed with the plan and demonstrated an understanding of the instructions.   The patient was advised to call back or seek an in-person evaluation if the symptoms worsen or if the condition fails to improve as anticipated.  I provided 30 minutes of non-face-to-face time during this encounter.  HISTORY/CURRENT STATUS: HPI  for routine med check.   States he has been more anxious.  Sometimes has panic attacks but usually it a generalized sense of unease, like something bad is going to happen.  Has palpitations sometimes.  They resolve quickly.  Has felt a little more down.  But he is able to enjoy things.  Energy and motivation depends on the day but never so bad that he does not get out of bed or do things he wants to do.  Does not cry easily.  Not isolating.  Sleeps well does not need the trazodone every night.  No suicidal or homicidal thoughts.  Patient denies increased energy with decreased need for  sleep, no increased talkativeness, no racing thoughts, no impulsivity or risky behaviors, no increased spending, no increased libido, no grandiosity, no increased irritability or anger, and no hallucinations.  Denies dizziness, syncope, seizures, numbness, tingling, tremor, tics, unsteady gait, slurred speech, confusion. Denies muscle or joint pain, stiffness, or dystonia.  Individual Medical History/ Review of Systems: Changes? :Yes    had to go to the ER 11/16/2020, note reviewed.   Past medications for mental health diagnoses include: Adderall, Wellbutrin, Buspar, Lexapro, Trazodone, Topamax, Cymbalta  Allergies: Soy allergy  Current Medications:  Current Outpatient Medications:    acetaminophen (TYLENOL) 500 MG tablet, Take 500 mg by mouth every 6 (six) hours as needed. , Disp: , Rfl:    amphetamine-dextroamphetamine (ADDERALL XR) 15 MG 24 hr capsule, Take 1 capsule by mouth every morning., Disp: 30 capsule, Rfl: 0   busPIRone (BUSPAR) 15 MG tablet, Take 1 tablet (15 mg total) by mouth 3 (three) times daily., Disp: 90 tablet, Rfl: 1   lamoTRIgine (LAMICTAL) 100 MG tablet, Take 1 tablet (100 mg total) by mouth daily., Disp: 30 tablet, Rfl: 5   testosterone cypionate (DEPOTESTOSTERONE CYPIONATE) 200 MG/ML injection, Inject 100 mg into the skin once a week., Disp: , Rfl:    traZODone (DESYREL) 50 MG tablet, TAKE ONE OR TWO TABLETS BY MOUTH AT BEDTIME AS NEEDED FOR SLEEP, Disp: 180 tablet, Rfl: 0   escitalopram (LEXAPRO) 20 MG tablet, Take 1 tablet (20 mg total) by mouth daily., Disp: 30 tablet, Rfl: 0   glucosamine-chondroitin 500-400 MG tablet,  Take 1 tablet by mouth daily. (Patient not taking: No sig reported), Disp: , Rfl:    mometasone-formoterol (DULERA) 200-5 MCG/ACT AERO, Inhale 1 puff into the lungs daily. (Patient not taking: Reported on 02/07/2021), Disp: , Rfl:    omeprazole (PRILOSEC) 40 MG capsule, Take 1 capsule (40 mg total) by mouth daily. (Patient not taking: Reported on  02/07/2021), Disp: 90 capsule, Rfl: 3 Medication Side Effects: none  Family Medical/ Social History: Changes? No  MENTAL HEALTH EXAM:  There were no vitals taken for this visit.There is no height or weight on file to calculate BMI.  General Appearance: Casual, Neat and Well Groomed  Eye Contact:  Good  Speech:  Clear and Coherent and Normal Rate  Volume:  Normal  Mood:  Euthymic  Affect:  Congruent  Thought Process:  Goal Directed and Descriptions of Associations: Intact  Orientation:  Full (Time, Place, and Person)  Thought Content: Logical   Suicidal Thoughts:  No  Homicidal Thoughts:  No  Memory:  WNL  Judgement:  Good  Insight:  Good  Psychomotor Activity:  Normal  Concentration:  Concentration: Good and Attention Span: Good  Recall:  Good  Fund of Knowledge: Good  Language: Good  Assets:  Desire for Improvement  ADL's:  Intact  Cognition: WNL  Prognosis:  Good   Labs 11/16/2020 CBC with differential normal CM P essentially normal, see specifics on chart  DIAGNOSES:    ICD-10-CM   1. Generalized anxiety disorder  F41.1     2. Depression, major, recurrent, in partial remission (HCC)  F33.41     3. Attention deficit hyperactivity disorder (ADHD), unspecified ADHD type  F90.9     4. Insomnia due to other mental disorder  F51.05    F99        Receiving Psychotherapy: Yes With Lyndal Rainbow   RECOMMENDATIONS:  PDMP was reviewed.  Last Adderall filled 01/05/2021. I provided 30 minutes of non-face-to-face time during this encounter, including time spent before and after the visit in records review, medical decision making, counseling pertinent to today's visit, and charting.  We discussed the anxiety, I recommend increasing the BuSpar.  Discussed the reasoning behind that and he would like to try it. Continue Adderall XR 15 mg q am. Continue Lamictal 100 mg, 1 p.o. daily. Increase Buspar to 15 mg, 1 p.o. 3 times daily. Continue Lexapro 20 mg, 1 p.o.  daily. Continue trazodone 50 mg, 1-2 nightly as needed sleep. Continue treatment per neurology for postconcussive syndrome. Continue counseling with Lyndal Rainbow. Return in 6-8 wks.  Melony Overly, PA-C

## 2021-02-11 DIAGNOSIS — R6884 Jaw pain: Secondary | ICD-10-CM | POA: Diagnosis not present

## 2021-02-11 DIAGNOSIS — M7989 Other specified soft tissue disorders: Secondary | ICD-10-CM | POA: Diagnosis not present

## 2021-02-13 DIAGNOSIS — Z79899 Other long term (current) drug therapy: Secondary | ICD-10-CM | POA: Diagnosis not present

## 2021-02-13 DIAGNOSIS — F640 Transsexualism: Secondary | ICD-10-CM | POA: Diagnosis not present

## 2021-02-19 DIAGNOSIS — F419 Anxiety disorder, unspecified: Secondary | ICD-10-CM | POA: Diagnosis not present

## 2021-02-19 DIAGNOSIS — F32A Depression, unspecified: Secondary | ICD-10-CM | POA: Diagnosis not present

## 2021-02-19 DIAGNOSIS — R569 Unspecified convulsions: Secondary | ICD-10-CM | POA: Diagnosis not present

## 2021-02-19 DIAGNOSIS — Z0000 Encounter for general adult medical examination without abnormal findings: Secondary | ICD-10-CM | POA: Diagnosis not present

## 2021-03-06 ENCOUNTER — Other Ambulatory Visit: Payer: Self-pay | Admitting: Physician Assistant

## 2021-03-30 ENCOUNTER — Encounter: Payer: Self-pay | Admitting: Physician Assistant

## 2021-03-30 ENCOUNTER — Telehealth (INDEPENDENT_AMBULATORY_CARE_PROVIDER_SITE_OTHER): Payer: BC Managed Care – PPO | Admitting: Physician Assistant

## 2021-03-30 DIAGNOSIS — F99 Mental disorder, not otherwise specified: Secondary | ICD-10-CM

## 2021-03-30 DIAGNOSIS — R519 Headache, unspecified: Secondary | ICD-10-CM | POA: Diagnosis not present

## 2021-03-30 DIAGNOSIS — F411 Generalized anxiety disorder: Secondary | ICD-10-CM | POA: Diagnosis not present

## 2021-03-30 DIAGNOSIS — F419 Anxiety disorder, unspecified: Secondary | ICD-10-CM | POA: Diagnosis not present

## 2021-03-30 DIAGNOSIS — F3341 Major depressive disorder, recurrent, in partial remission: Secondary | ICD-10-CM | POA: Diagnosis not present

## 2021-03-30 DIAGNOSIS — F909 Attention-deficit hyperactivity disorder, unspecified type: Secondary | ICD-10-CM

## 2021-03-30 DIAGNOSIS — F5105 Insomnia due to other mental disorder: Secondary | ICD-10-CM | POA: Diagnosis not present

## 2021-03-30 DIAGNOSIS — M791 Myalgia, unspecified site: Secondary | ICD-10-CM | POA: Diagnosis not present

## 2021-03-30 DIAGNOSIS — Z79899 Other long term (current) drug therapy: Secondary | ICD-10-CM | POA: Diagnosis not present

## 2021-03-30 DIAGNOSIS — S0990XA Unspecified injury of head, initial encounter: Secondary | ICD-10-CM | POA: Diagnosis not present

## 2021-03-30 DIAGNOSIS — Z91018 Allergy to other foods: Secondary | ICD-10-CM | POA: Diagnosis not present

## 2021-03-30 DIAGNOSIS — M542 Cervicalgia: Secondary | ICD-10-CM | POA: Diagnosis not present

## 2021-03-30 DIAGNOSIS — Y92410 Unspecified street and highway as the place of occurrence of the external cause: Secondary | ICD-10-CM | POA: Diagnosis not present

## 2021-03-30 DIAGNOSIS — F32A Depression, unspecified: Secondary | ICD-10-CM | POA: Diagnosis not present

## 2021-03-30 DIAGNOSIS — S199XXA Unspecified injury of neck, initial encounter: Secondary | ICD-10-CM | POA: Diagnosis not present

## 2021-03-30 MED ORDER — BUSPIRONE HCL 15 MG PO TABS: 22.5000 mg | ORAL_TABLET | Freq: Two times a day (BID) | ORAL | 1 refills | Status: DC

## 2021-03-30 MED ORDER — AMPHETAMINE-DEXTROAMPHET ER 15 MG PO CP24: 15.0000 mg | ORAL_CAPSULE | ORAL | 0 refills | Status: DC

## 2021-03-30 MED ORDER — ESCITALOPRAM OXALATE 20 MG PO TABS: 20.0000 mg | ORAL_TABLET | Freq: Every day | ORAL | 5 refills | Status: DC

## 2021-03-30 NOTE — Progress Notes (Signed)
[facility]  Patient ID: Jordan Robinson,  MRN: [id number]  PCP: Lewis Moccasin, MD  Date of Evaluation: 03/30/2021  time spent:20 minutes  Chief Complaint:  Chief Complaint   Anxiety; Depression; Insomnia; Follow-up     Virtual Visit via Telehealth  I connected with patient by a video enabled telemedicine application with their informed consent, and verified patient privacy and that I am speaking with the correct person using two identifiers.  I am private, in my office and the patient is at home.  I discussed the limitations, risks, security and privacy concerns of performing an evaluation and management service by video and the availability of in person appointments. I also discussed with the patient that there may be a patient responsible charge related to this service. The patient expressed understanding and agreed to proceed.   I discussed the assessment and treatment plan with the patient. The patient was provided an opportunity to ask questions and all were answered. The patient agreed with the plan and demonstrated an understanding of the instructions.   The patient was advised to call back or seek an in-person evaluation if the symptoms worsen or if the condition fails to improve as anticipated.  I provided 20 minutes of non-face-to-face time during this encounter.  HISTORY/CURRENT STATUS: HPI  for routine med check.   Communication error on our last visit instructions and he's only been taking Buspar 15 mg bid instead of tid as ordered. Having less PA, maybe once every 2 weeks, but does still have generalized anxiety several days a week.  Work is stressful because "other people will not do their jobs.  I think they need Adderall."  He does breathing exercises and coping skills that help with the anxiety.  He is not having racing thoughts.  When he does get anxious it is more like a sense of unease, aggravation with things going on around him.  Patient  denies loss of interest in usual activities and is able to enjoy things.  Denies decreased energy or motivation.  Appetite has not changed.  No extreme sadness, tearfulness, or feelings of hopelessness.  Sleeps well most of the time.  Denies suicidal or homicidal thoughts.  States that attention is good without easy distractibility.  Able to focus on things and finish tasks to completion.  Only takes the Adderall on days that he works.  It is still very effective.  Denies dizziness, syncope, seizures, numbness, tingling, tremor, tics, unsteady gait, slurred speech, confusion. Denies muscle or joint pain, stiffness, or dystonia.  Individual Medical History/ Review of Systems: Changes? :Yes    had a head cold 3 weeks ago which is still lingering.  He also states his hands "lock up" when he is trying to do something.  He saw a provider yesterday but they do not know what is wrong.  He is seeing PT already as well as chiropractor.  He is waiting to get in with his neurologist to discuss this problem.  Past medications for mental health diagnoses include: Adderall, Wellbutrin, Buspar, Lexapro, Trazodone, Topamax, Cymbalta  Allergies: Soy allergy  Current Medications:  Current Outpatient Medications:    acetaminophen (TYLENOL) 500 MG tablet, Take 500 mg by mouth every 6 (six) hours as needed. , Disp: , Rfl:    cholecalciferol (VITAMIN D3) 25 MCG (1000 UNIT) tablet, Take 1,000 Units by mouth daily., Disp: , Rfl:    lamoTRIgine (LAMICTAL) 100 MG tablet, Take 1 tablet (100 mg total) by mouth daily., Disp: 30 tablet, Rfl:  5   Omega-3 Fatty Acids (FISH OIL) 1000 MG CAPS, Take by mouth., Disp: , Rfl:    testosterone cypionate (DEPOTESTOSTERONE CYPIONATE) 200 MG/ML injection, Inject 100 mg into the skin once a week., Disp: , Rfl:    traZODone (DESYREL) 50 MG tablet, TAKE ONE OR TWO TABLETS BY MOUTH AT BEDTIME AS NEEDED FOR SLEEP, Disp: 180 tablet, Rfl: 0   amphetamine-dextroamphetamine (ADDERALL XR) 15 MG 24  hr capsule, Take 1 capsule by mouth every morning., Disp: 30 capsule, Rfl: 0   busPIRone (BUSPAR) 15 MG tablet, Take 1.5 tablets (22.5 mg total) by mouth 2 (two) times daily., Disp: 90 tablet, Rfl: 1   escitalopram (LEXAPRO) 20 MG tablet, Take 1 tablet (20 mg total) by mouth daily., Disp: 30 tablet, Rfl: 5   glucosamine-chondroitin 500-400 MG tablet, Take 1 tablet by mouth daily. (Patient not taking: Reported on 01/04/2020), Disp: , Rfl:    mometasone-formoterol (DULERA) 200-5 MCG/ACT AERO, Inhale 1 puff into the lungs daily. (Patient not taking: Reported on 02/07/2021), Disp: , Rfl:    omeprazole (PRILOSEC) 40 MG capsule, Take 1 capsule (40 mg total) by mouth daily. (Patient not taking: Reported on 02/07/2021), Disp: 90 capsule, Rfl: 3 Medication Side Effects: none  Family Medical/ Social History: Changes? No  MENTAL HEALTH EXAM:  There were no vitals taken for this visit.There is no height or weight on file to calculate BMI.  General Appearance: Casual, Neat and Well Groomed  Eye Contact:  Good  Speech:  Clear and Coherent and Normal Rate  Volume:  Normal  Mood:  Euthymic  Affect:  Congruent  Thought Process:  Goal Directed and Descriptions of Associations: Circumstantial  Orientation:  Full (Time, Place, and Person)  Thought Content: Logical   Suicidal Thoughts:  No  Homicidal Thoughts:  No  Memory:  WNL  Judgement:  Good  Insight:  Good  Psychomotor Activity:  Normal  Concentration:  Concentration: Good and Attention Span: Good  Recall:  Good  Fund of Knowledge: Good  Language: Good  Assets:  Desire for Improvement  ADL's:  Intact  Cognition: WNL  Prognosis:  Good   Labs  03/29/2021 CBC with differential, CMP, magnesium, GFR, TSH all normal.  See results on chart.  DIAGNOSES:    ICD-10-CM   1. Generalized anxiety disorder  F41.1     2. Depression, major, recurrent, in partial remission (HCC)  F33.41     3. Attention deficit hyperactivity disorder (ADHD), unspecified  ADHD type  F90.9     4. Insomnia due to other mental disorder  F51.05    F99         Receiving Psychotherapy: Yes With Lyndal Rainbow   RECOMMENDATIONS:  PDMP was reviewed.  Last Adderall filled 01/05/2021. I provided 20 minutes of non-face-to-face time during this encounter, including time spent before and after the visit in records review, medical decision making, counseling pertinent to today's visit, and charting.  We discussed the BuSpar.  Because he is still having increased anxiety I do recommend increasing the dose.  We agree to bump it up only a little bit and can always go up more if needed. Continue Adderall XR 15 mg q am. Continue Lamictal 100 mg, 1 p.o. daily. Increase Buspar 15 mg, to 1.5 pills p.o. twice daily.   Continue Lexapro 20 mg, 1 p.o. daily. Continue trazodone 50 mg, 1-2 nightly as needed sleep. Continue treatment per neurology for postconcussive syndrome. Continue counseling with Lyndal Rainbow. Return in 6-8 wks.  Melony Overly, PA-C

## 2021-04-12 ENCOUNTER — Other Ambulatory Visit: Payer: Self-pay | Admitting: Physician Assistant

## 2021-04-12 ENCOUNTER — Telehealth: Payer: Self-pay | Admitting: Physician Assistant

## 2021-04-12 MED ORDER — AMPHETAMINE-DEXTROAMPHETAMINE 15 MG PO TABS: 15.0000 mg | ORAL_TABLET | Freq: Every day | ORAL | 0 refills | Status: DC

## 2021-04-12 NOTE — Telephone Encounter (Signed)
Patient called in for refill on Adderall. Jordan Robinson says that TH asked him to call in to see if pharmacy had prescription in stock due to the shortage. States that don't have his regular Adderall XR but they do have the regular without XR in stock. Would like prescription sent over for that. Ph: [phone]. Appt 2/9. Pharmacy [facility] [address] [place], [place]

## 2021-04-12 NOTE — Telephone Encounter (Signed)
Please give him a call, I did send in the Adderall 15 mg.  Also call [facility] and cancel the prescription for Adderall XR that was sent in on 12/29.  Thanks.

## 2021-04-12 NOTE — Telephone Encounter (Signed)
LVM informing pt and cancelled at [facility]

## 2021-04-27 ENCOUNTER — Telehealth: Payer: Self-pay | Admitting: Physician Assistant

## 2021-04-27 NOTE — Telephone Encounter (Signed)
Pt left a message stating that he switched to regular adderall 15 mg . It is making him sick. He would like to go down on the dose. Please call him at [phone]

## 2021-04-27 NOTE — Telephone Encounter (Signed)
LVM to rtc 

## 2021-05-11 ENCOUNTER — Encounter: Payer: Self-pay | Admitting: Physician Assistant

## 2021-05-11 ENCOUNTER — Ambulatory Visit (INDEPENDENT_AMBULATORY_CARE_PROVIDER_SITE_OTHER): Payer: BC Managed Care – PPO | Admitting: Physician Assistant

## 2021-05-11 DIAGNOSIS — F99 Mental disorder, not otherwise specified: Secondary | ICD-10-CM

## 2021-05-11 DIAGNOSIS — F3341 Major depressive disorder, recurrent, in partial remission: Secondary | ICD-10-CM

## 2021-05-11 DIAGNOSIS — F5105 Insomnia due to other mental disorder: Secondary | ICD-10-CM

## 2021-05-11 DIAGNOSIS — F411 Generalized anxiety disorder: Secondary | ICD-10-CM

## 2021-05-11 DIAGNOSIS — F909 Attention-deficit hyperactivity disorder, unspecified type: Secondary | ICD-10-CM

## 2021-05-11 MED ORDER — ADZENYS XR-ODT 9.4 MG PO TBED: 9.4000 mg | EXTENDED_RELEASE_TABLET | Freq: Every day | ORAL | 0 refills | Status: DC

## 2021-05-11 MED ORDER — LAMOTRIGINE 100 MG PO TABS: 100.0000 mg | ORAL_TABLET | Freq: Every day | ORAL | 5 refills | Status: DC

## 2021-05-11 MED ORDER — TRAZODONE HCL 100 MG PO TABS: 100.0000 mg | ORAL_TABLET | Freq: Every evening | ORAL | 1 refills | Status: DC | PRN

## 2021-05-11 MED ORDER — BUSPIRONE HCL 30 MG PO TABS: 30.0000 mg | ORAL_TABLET | Freq: Two times a day (BID) | ORAL | 1 refills | Status: DC

## 2021-05-11 NOTE — Progress Notes (Signed)
[facility]  Patient ID: Jordan Robinson,  MRN: [id number]  PCP: Lewis Moccasin, MD  Date of Evaluation: 05/11/2021 time spent:25 minutes  Chief Complaint:  Chief Complaint   ADHD; Anxiety; Insomnia; Depression; Follow-up    Virtual Visit via Telehealth  I connected with patient by telephone, with their informed consent, and verified patient privacy and that I am speaking with the correct person using two identifiers.  I am private, in my office and the patient is at home.  I discussed the limitations, risks, security and privacy concerns of performing an evaluation and management service by telephone and the availability of in person appointments. I also discussed with the patient that there may be a patient responsible charge related to this service. The patient expressed understanding and agreed to proceed.   I discussed the assessment and treatment plan with the patient. The patient was provided an opportunity to ask questions and all were answered. The patient agreed with the plan and demonstrated an understanding of the instructions.   The patient was advised to call back or seek an in-person evaluation if the symptoms worsen or if the condition fails to improve as anticipated.  I provided 25 minutes of non-face-to-face time during this encounter.  HISTORY/CURRENT STATUS: HPI  for routine med check.   Since the last visit he called and we tried Adderall IR instead of the XR which he was unable to get due to the [employer].  The IR made him feel terrible, caused headaches, and upset his stomach.  He stopped it.  States his attention span is pretty bad.  Has a hard time focusing on his job.  Gets distracted really easily.  6 weeks ago we increased the BuSpar.  It has helped some, but not enough.  He is more anxious at work, gets aggravated with people when they do not do their job when he is doing his.  Does not usually get panic attacks but close.  More of a  sense of jitteriness, feels wound up and cannot relax.  Patient denies loss of interest in usual activities and is able to enjoy things.  Denies decreased energy or motivation.  Appetite has not changed.  No extreme sadness, tearfulness, or feelings of hopelessness.  Sleeps well most of the time but is having to take 100 mg of the trazodone, 50 mg does not work now.  Denies suicidal or homicidal thoughts.  Denies dizziness, syncope, seizures, numbness, tingling, tremor, tics, unsteady gait, slurred speech, confusion. Denies muscle or joint pain, stiffness, or dystonia.  Individual Medical History/ Review of Systems: Changes? :No      Past medications for mental health diagnoses include: Adderall XR was effective, Adderall IR caused headache and stomach upset, Wellbutrin, Buspar, Lexapro, Trazodone, Topamax, Cymbalta  Allergies: Soy allergy  Current Medications:  Current Outpatient Medications:    acetaminophen (TYLENOL) 500 MG tablet, Take 500 mg by mouth every 6 (six) hours as needed. , Disp: , Rfl:    Amphetamine ER (ADZENYS XR-ODT) 9.4 MG TBED, Take 9.4 mg by mouth daily after breakfast., Disp: 30 tablet, Rfl: 0   busPIRone (BUSPAR) 30 MG tablet, Take 1 tablet (30 mg total) by mouth 2 (two) times daily., Disp: 180 tablet, Rfl: 1   cholecalciferol (VITAMIN D3) 25 MCG (1000 UNIT) tablet, Take 1,000 Units by mouth daily., Disp: , Rfl:    escitalopram (LEXAPRO) 20 MG tablet, Take 1 tablet (20 mg total) by mouth daily., Disp: 30 tablet, Rfl: 5   Omega-3  Fatty Acids (FISH OIL) 1000 MG CAPS, Take by mouth., Disp: , Rfl:    testosterone cypionate (DEPOTESTOSTERONE CYPIONATE) 200 MG/ML injection, Inject 100 mg into the skin once a week., Disp: , Rfl:    traZODone (DESYREL) 100 MG tablet, Take 1 tablet (100 mg total) by mouth at bedtime as needed for sleep., Disp: 90 tablet, Rfl: 1   amphetamine-dextroamphetamine (ADDERALL) 15 MG tablet, Take 1 tablet by mouth daily. (Patient not taking: Reported on  05/11/2021), Disp: 30 tablet, Rfl: 0   glucosamine-chondroitin 500-400 MG tablet, Take 1 tablet by mouth daily. (Patient not taking: Reported on 01/04/2020), Disp: , Rfl:    lamoTRIgine (LAMICTAL) 100 MG tablet, Take 1 tablet (100 mg total) by mouth daily., Disp: 30 tablet, Rfl: 5   mometasone-formoterol (DULERA) 200-5 MCG/ACT AERO, Inhale 1 puff into the lungs daily. (Patient not taking: Reported on 02/07/2021), Disp: , Rfl:    omeprazole (PRILOSEC) 40 MG capsule, Take 1 capsule (40 mg total) by mouth daily. (Patient not taking: Reported on 02/07/2021), Disp: 90 capsule, Rfl: 3 Medication Side Effects: none  Family Medical/ Social History: Changes? No  MENTAL HEALTH EXAM:  There were no vitals taken for this visit.There is no height or weight on file to calculate BMI.  General Appearance:  Unable to assess  Eye Contact:   Unable to assess  Speech:  Clear and Coherent and Normal Rate  Volume:  Normal  Mood:  Euthymic  Affect:   Unable to assess  Thought Process:  Goal Directed and Descriptions of Associations: Circumstantial  Orientation:  Full (Time, Place, and Person)  Thought Content: Logical   Suicidal Thoughts:  No  Homicidal Thoughts:  No  Memory:  WNL  Judgement:  Good  Insight:  Good  Psychomotor Activity:   Unable to assess  Concentration:  Concentration: Poor and Attention Span: Poor  Recall:  Good  Fund of Knowledge: Good  Language: Good  Assets:  Desire for Improvement  ADL's:  Intact  Cognition: WNL  Prognosis:  Good    DIAGNOSES:    ICD-10-CM   1. Attention deficit hyperactivity disorder (ADHD), unspecified ADHD type  F90.9     2. Depression, major, recurrent, in partial remission (HCC)  F33.41     3. Generalized anxiety disorder  F41.1     4. Insomnia due to other mental disorder  F51.05    F99        Receiving Psychotherapy: Yes With Lyndal Rainbow   RECOMMENDATIONS:  PDMP was reviewed.  Last Adderall IR filled 04/12/2021. I provided 25 minutes of  non-face-to-face time during this encounter, including time spent before and after the visit in records review, medical decision making, counseling pertinent to today's visit, and charting.  We discussed increasing the BuSpar again.  He would like to try it.   Discussed changing Adderall to [facility].  Explained that this is pretty much the same thing as Adderall, it is dose differently because of the way the pill is manufactured but is equivalent.  He will take it exactly as he has the Adderall.  Due to this being a branded drug and PAs will most likely be denied and even if the medication is approved it would be extremely expensive because of the tier.  The drug company has a relationship with a couple of pharmacies, [facility] in [place], where he lives, they charged $35 per month.  He would like to change to this. Discontinue Adderall.  He already has. Start adzenys XR ODT 9.4  mg, 1 p.o. every morning after breakfast. Continue Lamictal 100 mg, 1 p.o. daily. Increase BuSpar to 30 mg, 1 p.o. twice daily. Continue Lexapro 20 mg, 1 p.o. daily. Continue trazodone 100 mg qhs as needed sleep. Continue treatment per neurology for postconcussive syndrome. Continue counseling with Lyndal Rainbow. Return in 6 weeks.  Melony Overly, PA-C

## 2021-06-29 ENCOUNTER — Telehealth: Payer: Self-pay | Admitting: Physician Assistant

## 2021-07-17 ENCOUNTER — Ambulatory Visit (INDEPENDENT_AMBULATORY_CARE_PROVIDER_SITE_OTHER): Payer: Self-pay | Admitting: Physician Assistant

## 2021-07-17 DIAGNOSIS — Z91199 Patient's noncompliance with other medical treatment and regimen due to unspecified reason: Secondary | ICD-10-CM

## 2021-07-17 NOTE — Progress Notes (Signed)
No show for telehealth appointment.

## 2021-10-19 ENCOUNTER — Telehealth (INDEPENDENT_AMBULATORY_CARE_PROVIDER_SITE_OTHER): Payer: BLUE CROSS/BLUE SHIELD | Admitting: Physician Assistant

## 2021-10-19 ENCOUNTER — Encounter: Payer: Self-pay | Admitting: Physician Assistant

## 2021-10-19 DIAGNOSIS — F411 Generalized anxiety disorder: Secondary | ICD-10-CM | POA: Diagnosis not present

## 2021-10-19 DIAGNOSIS — F909 Attention-deficit hyperactivity disorder, unspecified type: Secondary | ICD-10-CM

## 2021-10-19 DIAGNOSIS — F331 Major depressive disorder, recurrent, moderate: Secondary | ICD-10-CM

## 2021-10-19 MED ORDER — ADZENYS XR-ODT 9.4 MG PO TBED: 9.4000 mg | EXTENDED_RELEASE_TABLET | Freq: Two times a day (BID) | ORAL | 0 refills | Status: DC

## 2021-10-19 MED ORDER — ESCITALOPRAM OXALATE 20 MG PO TABS: 30.0000 mg | ORAL_TABLET | Freq: Every day | ORAL | 1 refills | Status: DC

## 2021-10-19 MED ORDER — HYDROXYZINE HCL 10 MG PO TABS: 10.0000 mg | ORAL_TABLET | Freq: Three times a day (TID) | ORAL | 1 refills | Status: DC | PRN

## 2021-10-19 NOTE — Progress Notes (Signed)
[facility]  Patient ID: Jordan Robinson,  MRN: [id number]  PCP: Lewis Moccasin, MD  Date of Evaluation: 10/19/2021 time spent:30 minutes  Chief Complaint:   Virtual Visit via Telehealth  I connected with patient by a video enabled telemedicine application with their informed consent, and verified patient privacy and that I am speaking with the correct person using two identifiers.  I am private, in my office and the patient is at home.  I discussed the limitations, risks, security and privacy concerns of performing an evaluation and management service by video and the availability of in person appointments. I also discussed with the patient that there may be a patient responsible charge related to this service. The patient expressed understanding and agreed to proceed.   I discussed the assessment and treatment plan with the patient. The patient was provided an opportunity to ask questions and all were answered. The patient agreed with the plan and demonstrated an understanding of the instructions.   The patient was advised to call back or seek an in-person evaluation if the symptoms worsen or if the condition fails to improve as anticipated.  I provided 30 minutes of non-face-to-face time during this encounter.  HISTORY/CURRENT STATUS: HPI  for routine med check.   Having more anxiety and panic attacks.  The attacks occur a few times a week, no warning, no known trigger.  He feels jittery inside will have palpitations that last for a few minutes.  No sweating or shortness of breath.  He does deep breathing and meditation which helps sometimes.  States he is a little depressed, just does not want to do much of anything.  Energy and motivation are low although it does not affect his work.  ADLs and personal hygiene are normal.  Appetite is normal and weight is stable.  Not isolating.  Not crying easily.  No suicidal or homicidal thoughts.  At the last visit we changed  Adderall to Adzenys XR ODT because of the [employer].  States the Adzenys XR ODT does work but it only last for about 4 hours.  Feels that he needs it twice a day like he did take the Adderall previously.  He only takes it as needed.  Patient denies increased energy with decreased need for sleep, no increased talkativeness, no racing thoughts, no impulsivity or risky behaviors, no increased spending, no increased libido, no grandiosity, no increased irritability or anger, and no hallucinations.  Denies dizziness, syncope, seizures, numbness, tingling, tremor, tics, unsteady gait, slurred speech, confusion. Denies muscle or joint pain, stiffness, or dystonia.  Individual Medical History/ Review of Systems: Changes? :No      Past medications for mental health diagnoses include: Adderall XR was effective, Adderall IR caused headache and stomach upset, Wellbutrin, Buspar, Lexapro, Trazodone, Topamax, Cymbalta  Allergies: Soy allergy  Current Medications:  Current Outpatient Medications:    acetaminophen (TYLENOL) 500 MG tablet, Take 500 mg by mouth every 6 (six) hours as needed. , Disp: , Rfl:    busPIRone (BUSPAR) 30 MG tablet, Take 1 tablet (30 mg total) by mouth 2 (two) times daily., Disp: 180 tablet, Rfl: 1   cholecalciferol (VITAMIN D3) 25 MCG (1000 UNIT) tablet, Take 1,000 Units by mouth daily., Disp: , Rfl:    hydrOXYzine (ATARAX) 10 MG tablet, Take 1-3 tablets (10-30 mg total) by mouth 3 (three) times daily as needed., Disp: 60 tablet, Rfl: 1   lamoTRIgine (LAMICTAL) 100 MG tablet, Take 1 tablet (100 mg total) by mouth daily.,  Disp: 30 tablet, Rfl: 5   testosterone cypionate (DEPOTESTOSTERONE CYPIONATE) 200 MG/ML injection, Inject 100 mg into the skin once a week., Disp: , Rfl:    traZODone (DESYREL) 100 MG tablet, Take 1 tablet (100 mg total) by mouth at bedtime as needed for sleep., Disp: 90 tablet, Rfl: 1   Amphetamine ER (ADZENYS XR-ODT) 9.4 MG TBED, Take 9.4 mg by mouth 2 (two)  times daily with breakfast and lunch., Disp: 60 tablet, Rfl: 0   escitalopram (LEXAPRO) 20 MG tablet, Take 1.5 tablets (30 mg total) by mouth daily., Disp: 45 tablet, Rfl: 1   glucosamine-chondroitin 500-400 MG tablet, Take 1 tablet by mouth daily. (Patient not taking: Reported on 01/04/2020), Disp: , Rfl:    mometasone-formoterol (DULERA) 200-5 MCG/ACT AERO, Inhale 1 puff into the lungs daily. (Patient not taking: Reported on 02/07/2021), Disp: , Rfl:    Omega-3 Fatty Acids (FISH OIL) 1000 MG CAPS, Take by mouth., Disp: , Rfl:    omeprazole (PRILOSEC) 40 MG capsule, Take 1 capsule (40 mg total) by mouth daily. (Patient not taking: Reported on 02/07/2021), Disp: 90 capsule, Rfl: 3 Medication Side Effects: none  Family Medical/ Social History: Changes? No  MENTAL HEALTH EXAM:  There were no vitals taken for this visit.There is no height or weight on file to calculate BMI.  General Appearance: Casual and Well Groomed  Eye Contact:  Good  Speech:  Clear and Coherent and Normal Rate  Volume:  Normal  Mood:  Euthymic  Affect:  Congruent  Thought Process:  Goal Directed and Descriptions of Associations: Circumstantial  Orientation:  Full (Time, Place, and Person)  Thought Content: Logical   Suicidal Thoughts:  No  Homicidal Thoughts:  No  Memory:  WNL  Judgement:  Good  Insight:  Good  Psychomotor Activity:  Normal  Concentration:  Concentration: Poor and Attention Span: Poor  Recall:  Good  Fund of Knowledge: Good  Language: Good  Assets:  Desire for Improvement  ADL's:  Intact  Cognition: WNL  Prognosis:  Good    DIAGNOSES:    ICD-10-CM   1. Attention deficit hyperactivity disorder (ADHD), unspecified ADHD type  F90.9     2. Major depressive disorder, recurrent episode, moderate (HCC)  F33.1     3. Generalized anxiety disorder  F41.1       Receiving Psychotherapy: No    RECOMMENDATIONS:  PDMP was reviewed.  Adzenys XR ODT fill 05/17/2021.  Testosterone known to me. I  provided 30 minutes of non-face-to-face time during this encounter, including time spent before and after the visit in records review, medical decision making, counseling pertinent to today's visit, and charting.   We discussed to the anxiety.  Recommend adding hydroxyzine as a rescue medication.  Benefits, risks and side effects were discussed and he accepts.  Continue deep breathing exercises, physical exercise, and seeing a counselor.  All these things will help depression as well.  I also recommend increasing the Lexapro which will help with the depression and prevent anxiety.  Increased sexual problems or sweating may occur and he will report those to me if they do happen.  Recommend increasing the Adzenys XR ODT to twice a day as needed.  Increase adzenys XR ODT 9.4 mg, 1 p.o. every morning after breakfast and at lunchtime as needed. Continue BuSpar 30 mg, 1 p.o. twice daily. Continue Lexapro 20 mg, 1 p.o. daily. Increase Lexapro 20 mg to 1.5 pills daily. Start hydroxyzine 10 mg, 1-3 p.o. 3 times daily as needed. Continue  trazodone 100 mg qhs as needed sleep. He is looking for a Therapist, nutritional. Return in 6 weeks.  Melony Overly, PA-C

## 2021-10-26 ENCOUNTER — Other Ambulatory Visit: Payer: Self-pay | Admitting: Physician Assistant

## 2021-11-06 ENCOUNTER — Telehealth: Payer: Self-pay | Admitting: Physician Assistant

## 2021-11-06 NOTE — Telephone Encounter (Signed)
I'm not sure Sheralyn Boatman.  [name], what do you think about doing a PA? Would it be best to change back to Adderall and take the chance they can find it somewhere?

## 2021-11-06 NOTE — Telephone Encounter (Signed)
"  [name] " lvm stating the alternative medication for the Adderall has increased in price. Patient could not recall the name, but would like to try something else that is more cost effective.  Contact # [phone]

## 2021-11-06 NOTE — Telephone Encounter (Signed)
The price for adzenys with the voucher has increased to $75.Do you know if we can try a PA just to see if it will bring the cost down?

## 2021-11-08 NOTE — Telephone Encounter (Signed)
Contacted the pharmacist and she reports the manufacturer increased the cost of the medication with the co-pay card to $75.00.  He does not need a prior authorization, the rejection with insurance is the medication is not on his plan. There is not a way to get the medication cheaper that I am aware of.

## 2021-11-08 NOTE — Telephone Encounter (Signed)
No we are trying to figure out if doing a PA will help with cost of adzenys because the price with the voucher increased to $75

## 2021-11-08 NOTE — Telephone Encounter (Signed)
I received a PA request for his Escitalopram but not for the Adzeny, did pharmacy say he needed one done?

## 2021-11-08 NOTE — Telephone Encounter (Signed)
Thanks [name]. Jordan Robinson, please call and ask him to call around to different pharmacies, see who has the Adderall XR 15 mg, #30, let us know and then we'll send it in. Unfortunately most alternative medications to Adderall are also on backorder so if we changed to another medication, will probably end up with the same issue as Adderall.  Thanks.

## 2021-11-08 NOTE — Telephone Encounter (Signed)
If his insurance is already covering the medication a Prior Authorization isn't needed. I will contact his pharmacy for clarification.

## 2021-11-09 NOTE — Telephone Encounter (Signed)
Spoke to pt.He is going to call around

## 2021-12-01 ENCOUNTER — Telehealth (INDEPENDENT_AMBULATORY_CARE_PROVIDER_SITE_OTHER): Payer: BLUE CROSS/BLUE SHIELD | Admitting: Physician Assistant

## 2021-12-01 ENCOUNTER — Telehealth: Payer: Self-pay | Admitting: Physician Assistant

## 2021-12-01 ENCOUNTER — Other Ambulatory Visit: Payer: Self-pay

## 2021-12-01 ENCOUNTER — Encounter: Payer: Self-pay | Admitting: Physician Assistant

## 2021-12-01 DIAGNOSIS — F909 Attention-deficit hyperactivity disorder, unspecified type: Secondary | ICD-10-CM | POA: Diagnosis not present

## 2021-12-01 DIAGNOSIS — F5105 Insomnia due to other mental disorder: Secondary | ICD-10-CM

## 2021-12-01 DIAGNOSIS — F331 Major depressive disorder, recurrent, moderate: Secondary | ICD-10-CM | POA: Diagnosis not present

## 2021-12-01 DIAGNOSIS — F411 Generalized anxiety disorder: Secondary | ICD-10-CM

## 2021-12-01 MED ORDER — TRAZODONE HCL 100 MG PO TABS: 100.0000 mg | ORAL_TABLET | Freq: Every evening | ORAL | 5 refills | Status: DC | PRN

## 2021-12-01 MED ORDER — AMPHETAMINE-DEXTROAMPHETAMINE 10 MG PO TABS: ORAL_TABLET | ORAL | 0 refills | Status: DC

## 2021-12-01 MED ORDER — LAMOTRIGINE 100 MG PO TABS: 100.0000 mg | ORAL_TABLET | Freq: Every day | ORAL | 5 refills | Status: DC

## 2021-12-01 MED ORDER — ESCITALOPRAM OXALATE 20 MG PO TABS: 30.0000 mg | ORAL_TABLET | Freq: Every day | ORAL | 11 refills | Status: AC

## 2021-12-01 MED ORDER — HYDROXYZINE HCL 10 MG PO TABS: 10.0000 mg | ORAL_TABLET | Freq: Three times a day (TID) | ORAL | 5 refills | Status: AC | PRN

## 2021-12-01 MED ORDER — DEXMETHYLPHENIDATE HCL ER 10 MG PO CP24: 10.0000 mg | ORAL_CAPSULE | Freq: Every day | ORAL | 0 refills | Status: DC

## 2021-12-01 NOTE — Telephone Encounter (Signed)
Please let me know if you want me to pend and the quantity

## 2021-12-01 NOTE — Progress Notes (Unsigned)
[facility]  Patient ID: Jordan Robinson,  MRN: [id number]  PCP: Lewis Moccasin, MD  Date of Evaluation: 12/01/2021 time spent:30 minutes  Chief Complaint:   Virtual Visit via Telehealth  I connected with patient by a video enabled telemedicine application with their informed consent, and verified patient privacy and that I am speaking with the correct person using two identifiers.  I am private, in my office and the patient is at home.  I discussed the limitations, risks, security and privacy concerns of performing an evaluation and management service by video and the availability of in person appointments. I also discussed with the patient that there may be a patient responsible charge related to this service. The patient expressed understanding and agreed to proceed.   I discussed the assessment and treatment plan with the patient. The patient was provided an opportunity to ask questions and all were answered. The patient agreed with the plan and demonstrated an understanding of the instructions.   The patient was advised to call back or seek an in-person evaluation if the symptoms worsen or if the condition fails to improve as anticipated.  I provided 30 minutes of non-face-to-face time during this encounter.  HISTORY/CURRENT STATUS: HPI  for routine med check.    Denies dizziness, syncope, seizures, numbness, tingling, tremor, tics, unsteady gait, slurred speech, confusion. Denies muscle or joint pain, stiffness, or dystonia.  Individual Medical History/ Review of Systems: Changes? :No      Past medications for mental health diagnoses include: Adderall XR was effective, Adderall IR caused headache and stomach upset, Wellbutrin, Buspar, Lexapro, Trazodone, Topamax, Cymbalta,  Allergies: Soy allergy  Current Medications:  Current Outpatient Medications:    acetaminophen (TYLENOL) 500 MG tablet, Take 500 mg by mouth every 6 (six) hours as needed. , Disp: , Rfl:     busPIRone (BUSPAR) 30 MG tablet, Take 1 tablet (30 mg total) by mouth 2 (two) times daily., Disp: 180 tablet, Rfl: 1   cholecalciferol (VITAMIN D3) 25 MCG (1000 UNIT) tablet, Take 1,000 Units by mouth daily., Disp: , Rfl:    dexmethylphenidate (FOCALIN XR) 10 MG 24 hr capsule, Take 1 capsule (10 mg total) by mouth daily., Disp: 30 capsule, Rfl: 0   mometasone-formoterol (DULERA) 200-5 MCG/ACT AERO, Inhale 1 puff into the lungs daily., Disp: , Rfl:    Omega-3 Fatty Acids (FISH OIL) 1000 MG CAPS, Take by mouth., Disp: , Rfl:    testosterone cypionate (DEPOTESTOSTERONE CYPIONATE) 200 MG/ML injection, Inject 100 mg into the skin once a week., Disp: , Rfl:    escitalopram (LEXAPRO) 20 MG tablet, Take 1.5 tablets (30 mg total) by mouth daily., Disp: 45 tablet, Rfl: 11   glucosamine-chondroitin 500-400 MG tablet, Take 1 tablet by mouth daily. (Patient not taking: Reported on 01/04/2020), Disp: , Rfl:    hydrOXYzine (ATARAX) 10 MG tablet, Take 1-3 tablets (10-30 mg total) by mouth 3 (three) times daily as needed., Disp: 90 tablet, Rfl: 5   lamoTRIgine (LAMICTAL) 100 MG tablet, Take 1 tablet (100 mg total) by mouth daily., Disp: 30 tablet, Rfl: 5   omeprazole (PRILOSEC) 40 MG capsule, Take 1 capsule (40 mg total) by mouth daily. (Patient not taking: Reported on 02/07/2021), Disp: 90 capsule, Rfl: 3   traZODone (DESYREL) 100 MG tablet, Take 1 tablet (100 mg total) by mouth at bedtime as needed for sleep., Disp: 30 tablet, Rfl: 5 Medication Side Effects: none  Family Medical/ Social History: Changes? No  MENTAL HEALTH EXAM:  There were no  vitals taken for this visit.There is no height or weight on file to calculate BMI.  General Appearance: Casual and Well Groomed  Eye Contact:  Good  Speech:  Clear and Coherent and Normal Rate  Volume:  Normal  Mood:  Euthymic  Affect:  Congruent  Thought Process:  Goal Directed and Descriptions of Associations: Circumstantial  Orientation:  Full (Time, Place, and  Person)  Thought Content: Logical   Suicidal Thoughts:  No  Homicidal Thoughts:  No  Memory:  WNL  Judgement:  Good  Insight:  Good  Psychomotor Activity:  Normal  Concentration:  Concentration: Poor and Attention Span: Poor  Recall:  Good  Fund of Knowledge: Good  Language: Good  Assets:  Desire for Improvement  ADL's:  Intact  Cognition: WNL  Prognosis:  Good    DIAGNOSES:    ICD-10-CM   1. Attention deficit hyperactivity disorder (ADHD), unspecified ADHD type  F90.9     2. Generalized anxiety disorder  F41.1     3. Major depressive disorder, recurrent episode, moderate (HCC)  F33.1     4. Insomnia due to other mental disorder  F51.05    F99       Receiving Psychotherapy: No    RECOMMENDATIONS:  PDMP was reviewed.  Adzenys XR ODT fill 05/17/2021.  Testosterone known to me. I provided 20 minutes of non-face-to-face time during this encounter, including time spent before and after the visit in records review, medical decision making, counseling pertinent to today's visit, and charting.   He will call to make sure his pharmacy has the Focalin.  If they do not, he will find out which dose they have or if they have different doses of Adderall, call the office and we can cancel the Focalin and send in that specific dose of Adderall.  Discontinue Adzenys XR ODT secondary to cost. Continue BuSpar 30 mg, 1 p.o. twice daily. Start Focalin XR 10 mg, 1 p.o. every morning. Continue Lexapro 20 mg, 1 p.o. daily. Cont Lexapro 20 mg  1.5 pills daily. Continue hydroxyzine 10 mg, 1-3 p.o. 3 times daily as needed. Continue trazodone 100 mg qhs as needed sleep. He is looking for a Therapist, nutritional. Return in 6 weeks.  Melony Overly, PA-C

## 2021-12-01 NOTE — Telephone Encounter (Signed)
Yes please do, #60, 1 at breakfast and 1 at lunch. Thanks

## 2021-12-01 NOTE — Telephone Encounter (Signed)
Pended.

## 2021-12-01 NOTE — Telephone Encounter (Signed)
Pt LVM @ 11:15a.  Pt just had a visit with Rosey Bath.  Aggie Cosier asked pt to call to check on availability of Adderall 10 mg.  Pt called back to say it is available at [employer], [facility]).  Next appt 10/4

## 2022-01-03 ENCOUNTER — Encounter: Payer: Self-pay | Admitting: Physician Assistant

## 2022-01-03 ENCOUNTER — Telehealth (INDEPENDENT_AMBULATORY_CARE_PROVIDER_SITE_OTHER): Payer: BLUE CROSS/BLUE SHIELD | Admitting: Physician Assistant

## 2022-01-03 DIAGNOSIS — F3341 Major depressive disorder, recurrent, in partial remission: Secondary | ICD-10-CM | POA: Diagnosis not present

## 2022-01-03 DIAGNOSIS — F411 Generalized anxiety disorder: Secondary | ICD-10-CM | POA: Diagnosis not present

## 2022-01-03 DIAGNOSIS — F909 Attention-deficit hyperactivity disorder, unspecified type: Secondary | ICD-10-CM | POA: Diagnosis not present

## 2022-01-03 MED ORDER — AMPHETAMINE-DEXTROAMPHETAMINE 10 MG PO TABS: 10.0000 mg | ORAL_TABLET | Freq: Two times a day (BID) | ORAL | 0 refills | Status: DC

## 2022-01-03 MED ORDER — BUSPIRONE HCL 30 MG PO TABS: 30.0000 mg | ORAL_TABLET | Freq: Two times a day (BID) | ORAL | 11 refills | Status: AC

## 2022-01-03 MED ORDER — AMPHETAMINE-DEXTROAMPHETAMINE 10 MG PO TABS
10.0000 mg | ORAL_TABLET | Freq: Two times a day (BID) | ORAL | 0 refills | Status: DC
Start: 2022-01-03 — End: 2022-05-24

## 2022-01-03 NOTE — Progress Notes (Signed)
[facility]  Patient ID: Jordan Robinson,  MRN: [id number]  PCP: Lewis Moccasin, MD  Date of Evaluation: 01/03/2022 Time spent:20 minutes  Chief Complaint:  Chief Complaint   ADHD; Anxiety; Depression; Insomnia; Follow-up    Virtual Visit via Telehealth  I connected with patient by a video enabled telemedicine application with their informed consent, and verified patient privacy and that I am speaking with the correct person using two identifiers.  I am private, in my office and the patient is at home.  I discussed the limitations, risks, security and privacy concerns of performing an evaluation and management service by video and the availability of in person appointments. I also discussed with the patient that there may be a patient responsible charge related to this service. The patient expressed understanding and agreed to proceed.   I discussed the assessment and treatment plan with the patient. The patient was provided an opportunity to ask questions and all were answered. The patient agreed with the plan and demonstrated an understanding of the instructions.   The patient was advised to call back or seek an in-person evaluation if the symptoms worsen or if the condition fails to improve as anticipated.  I provided 20 minutes of non-face-to-face time during this encounter.  HISTORY/CURRENT STATUS: HPI  for routine med check.   Being back on Adderall has been amazing.  The anxiety is much better.  Since he is able to focus and get things done without so many distractions he is having a lot less anxiety.  He still has to take the hydroxyzine each morning, almost daily and sometimes throughout the day, but not nearly as often as in the past.  Work is going really well, he is so happy to be back on the Adderall.  States he is about 80% better since being on this current cocktail of medications.  Patient is able to enjoy things.  Energy and motivation are getting  better.  He was really drained after having had the flu recently.  No extreme sadness, tearfulness, or feelings of hopelessness.  Sleeps well most of the time. ADLs and personal hygiene are normal.   Appetite has not changed.  Weight is stable.  Not isolating.  Not crying easily.  Denies suicidal or homicidal thoughts.  Patient denies increased energy with decreased need for sleep, increased talkativeness, racing thoughts, impulsivity or risky behaviors, increased spending, increased libido, grandiosity, increased irritability or anger, paranoia, or hallucinations.  Denies dizziness, syncope, seizures, numbness, tingling, tremor, tics, unsteady gait, slurred speech, confusion. Denies muscle or joint pain, stiffness, or dystonia.  Individual Medical History/ Review of Systems: Changes? :No      Past medications for mental health diagnoses include: Adderall XR was effective, Adderall IR caused headache and stomach upset, Wellbutrin, Buspar, Lexapro, Trazodone, Topamax, Cymbalta, Adzenys   Allergies: Soy allergy  Current Medications:  Current Outpatient Medications:    acetaminophen (TYLENOL) 500 MG tablet, Take 500 mg by mouth every 6 (six) hours as needed. , Disp: , Rfl:    [START ON 02/02/2022] amphetamine-dextroamphetamine (ADDERALL) 10 MG tablet, Take 1 tablet (10 mg total) by mouth 2 (two) times daily with breakfast and lunch., Disp: 60 tablet, Rfl: 0   [START ON 03/03/2022] amphetamine-dextroamphetamine (ADDERALL) 10 MG tablet, Take 1 tablet (10 mg total) by mouth 2 (two) times daily with breakfast and lunch., Disp: 60 tablet, Rfl: 0   cholecalciferol (VITAMIN D3) 25 MCG (1000 UNIT) tablet, Take 1,000 Units by mouth daily., Disp: , Rfl:  escitalopram (LEXAPRO) 20 MG tablet, Take 1.5 tablets (30 mg total) by mouth daily., Disp: 45 tablet, Rfl: 11   hydrOXYzine (ATARAX) 10 MG tablet, Take 1-3 tablets (10-30 mg total) by mouth 3 (three) times daily as needed., Disp: 90 tablet, Rfl: 5    lamoTRIgine (LAMICTAL) 100 MG tablet, Take 1 tablet (100 mg total) by mouth daily., Disp: 30 tablet, Rfl: 5   mometasone-formoterol (DULERA) 200-5 MCG/ACT AERO, Inhale 1 puff into the lungs daily., Disp: , Rfl:    Omega-3 Fatty Acids (FISH OIL) 1000 MG CAPS, Take by mouth., Disp: , Rfl:    testosterone cypionate (DEPOTESTOSTERONE CYPIONATE) 200 MG/ML injection, Inject 100 mg into the skin once a week., Disp: , Rfl:    traZODone (DESYREL) 100 MG tablet, Take 1 tablet (100 mg total) by mouth at bedtime as needed for sleep., Disp: 30 tablet, Rfl: 5   amphetamine-dextroamphetamine (ADDERALL) 10 MG tablet, Take 1 tablet (10 mg total) by mouth 2 (two) times daily with breakfast and lunch., Disp: 60 tablet, Rfl: 0   busPIRone (BUSPAR) 30 MG tablet, Take 1 tablet (30 mg total) by mouth 2 (two) times daily., Disp: 60 tablet, Rfl: 11   glucosamine-chondroitin 500-400 MG tablet, Take 1 tablet by mouth daily. (Patient not taking: Reported on 01/04/2020), Disp: , Rfl:    omeprazole (PRILOSEC) 40 MG capsule, Take 1 capsule (40 mg total) by mouth daily. (Patient not taking: Reported on 02/07/2021), Disp: 90 capsule, Rfl: 3 Medication Side Effects: none  Family Medical/ Social History: Changes? No  MENTAL HEALTH EXAM:  There were no vitals taken for this visit.There is no height or weight on file to calculate BMI.  General Appearance: Casual and Well Groomed  Eye Contact:  Good  Speech:  Clear and Coherent and Normal Rate  Volume:  Normal  Mood:  Euthymic  Affect:  Congruent  Thought Process:  Goal Directed and Descriptions of Associations: Circumstantial  Orientation:  Full (Time, Place, and Person)  Thought Content: Logical   Suicidal Thoughts:  No  Homicidal Thoughts:  No  Memory:  WNL  Judgement:  Good  Insight:  Good  Psychomotor Activity:  Normal  Concentration:  Concentration: Good and Attention Span: Good  Recall:  Good  Fund of Knowledge: Good  Language: Good  Assets:  Desire for  Improvement Financial Resources/Insurance Housing Transportation Vocational/Educational  ADL's:  Intact  Cognition: WNL  Prognosis:  Good   DIAGNOSES:    ICD-10-CM   1. Attention deficit hyperactivity disorder (ADHD), unspecified ADHD type  F90.9     2. Generalized anxiety disorder  F41.1     3. 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  Adderall filled 12/18/2021.  Testosterone known to me.   I provided 20 minutes of non-face to face time during this encounter, including time spent before and after the visit in records review, medical decision making, counseling pertinent to today's visit, and charting.   I am glad to see him doing better!  No change in treatment necessary at this time.              Continue Adderall 10 mg, 1 at breakfast and 1 at lunch. Continue BuSpar 30 mg, 1 p.o. twice daily. Cont Lexapro 20 mg  1.5 pills daily. Continue hydroxyzine 10 mg, 1-3 p.o. 3 times daily as needed. Continue Lamictal 100 mg, 1 qd. Continue trazodone 100 mg qhs as needed  sleep. Return in 2 months.  Melony Overly, PA-C

## 2022-05-24 ENCOUNTER — Other Ambulatory Visit: Payer: Self-pay

## 2022-05-24 ENCOUNTER — Telehealth: Payer: 59 | Admitting: Physician Assistant

## 2022-05-24 ENCOUNTER — Encounter: Payer: Self-pay | Admitting: Physician Assistant

## 2022-05-24 DIAGNOSIS — F5105 Insomnia due to other mental disorder: Secondary | ICD-10-CM

## 2022-05-24 DIAGNOSIS — F909 Attention-deficit hyperactivity disorder, unspecified type: Secondary | ICD-10-CM | POA: Diagnosis not present

## 2022-05-24 DIAGNOSIS — F3341 Major depressive disorder, recurrent, in partial remission: Secondary | ICD-10-CM

## 2022-05-24 DIAGNOSIS — F411 Generalized anxiety disorder: Secondary | ICD-10-CM | POA: Diagnosis not present

## 2022-05-24 DIAGNOSIS — F99 Mental disorder, not otherwise specified: Secondary | ICD-10-CM

## 2022-05-24 MED ORDER — AMPHETAMINE-DEXTROAMPHETAMINE 10 MG PO TABS: 10.0000 mg | ORAL_TABLET | Freq: Two times a day (BID) | ORAL | 0 refills | Status: DC

## 2022-05-24 MED ORDER — TRAZODONE HCL 100 MG PO TABS: 100.0000 mg | ORAL_TABLET | Freq: Every evening | ORAL | 5 refills | Status: DC | PRN

## 2022-05-24 MED ORDER — TRAZODONE HCL 100 MG PO TABS: 100.0000 mg | ORAL_TABLET | Freq: Every evening | ORAL | 5 refills | Status: AC | PRN

## 2022-05-24 MED ORDER — LAMOTRIGINE 100 MG PO TABS: 100.0000 mg | ORAL_TABLET | Freq: Every day | ORAL | 5 refills | Status: DC

## 2022-05-24 NOTE — Progress Notes (Signed)
[facility]  Patient ID: Jordan Robinson,  MRN: [id number]  PCP: Fanny Bien, MD  Date of Evaluation: 05/24/2022 Time spent:20 minutes  Chief Complaint:  Chief Complaint   Anxiety; Depression; Insomnia; ADHD; Follow-up    Virtual Visit via Telehealth  I connected with patient by a video enabled telemedicine application, with their informed consent, and verified patient privacy and that I am speaking with the correct person using two identifiers.  I am private, in my office and the patient is at home.  I discussed the limitations, risks, security and privacy concerns of performing an evaluation and management service by video and the availability of in person appointments. I also discussed with the patient that there may be a patient responsible charge related to this service. The patient expressed understanding and agreed to proceed.   I discussed the assessment and treatment plan with the patient. The patient was provided an opportunity to ask questions and all were answered. The patient agreed with the plan and demonstrated an understanding of the instructions.   The patient was advised to call back or seek an in-person evaluation if the symptoms worsen or if the condition fails to improve as anticipated.  I provided 20 minutes of non-face-to-face time during this encounter.  HISTORY/CURRENT STATUS: HPI  for routine med check.   He is doing very well.  Being back on the Adderall has been really beneficial.  He is working on changing jobs. States that attention is good without easy distractibility.  Able to focus on things and finish tasks to completion.   Patient is able to enjoy things.  Energy and motivation are good.  No extreme sadness, tearfulness, or feelings of hopelessness.  Sleeps well most of the time. ADLs and personal hygiene are normal.   Appetite has not changed.  Weight is stable.  Uses hydroxyzine for anxiety and sometimes sleep.  He does not have  panic attacks but gets overwhelmed easily.  Begin back on the Adderall has helped the anxiety ironically.  Denies suicidal or homicidal thoughts.  Patient denies increased energy with decreased need for sleep, increased talkativeness, racing thoughts, impulsivity or risky behaviors, increased spending, increased libido, grandiosity, increased irritability or anger, paranoia, or hallucinations.  Denies dizziness, syncope, seizures, numbness, tingling, tremor, tics, unsteady gait, slurred speech, confusion. Denies muscle or joint pain, stiffness, or dystonia.  Individual Medical History/ Review of Systems: Changes? :No      Past medications for mental health diagnoses include: Adderall XR was effective, Adderall IR caused headache and stomach upset, Wellbutrin, Buspar, Lexapro, Trazodone, Topamax, Cymbalta, Adzenys   Allergies: Soy allergy  Current Medications:  Current Outpatient Medications:    busPIRone (BUSPAR) 30 MG tablet, Take 1 tablet (30 mg total) by mouth 2 (two) times daily., Disp: 60 tablet, Rfl: 11   cholecalciferol (VITAMIN D3) 25 MCG (1000 UNIT) tablet, Take 1,000 Units by mouth daily., Disp: , Rfl:    escitalopram (LEXAPRO) 20 MG tablet, Take 1.5 tablets (30 mg total) by mouth daily., Disp: 45 tablet, Rfl: 11   hydrOXYzine (ATARAX) 10 MG tablet, Take 1-3 tablets (10-30 mg total) by mouth 3 (three) times daily as needed., Disp: 90 tablet, Rfl: 5   Omega-3 Fatty Acids (FISH OIL) 1000 MG CAPS, Take by mouth., Disp: , Rfl:    testosterone cypionate (DEPOTESTOSTERONE CYPIONATE) 200 MG/ML injection, Inject 100 mg into the skin once a week., Disp: , Rfl:    acetaminophen (TYLENOL) 500 MG tablet, Take 500 mg by mouth every 6 (  six) hours as needed. , Disp: , Rfl:    amphetamine-dextroamphetamine (ADDERALL) 10 MG tablet, Take 1 tablet (10 mg total) by mouth 2 (two) times daily with breakfast and lunch., Disp: 60 tablet, Rfl: 0   [START ON 06/21/2022] amphetamine-dextroamphetamine (ADDERALL)  10 MG tablet, Take 1 tablet (10 mg total) by mouth 2 (two) times daily with breakfast and lunch., Disp: 60 tablet, Rfl: 0   [START ON 07/21/2022] amphetamine-dextroamphetamine (ADDERALL) 10 MG tablet, Take 1 tablet (10 mg total) by mouth 2 (two) times daily with breakfast and lunch., Disp: 60 tablet, Rfl: 0   glucosamine-chondroitin 500-400 MG tablet, Take 1 tablet by mouth daily. (Patient not taking: Reported on 01/04/2020), Disp: , Rfl:    lamoTRIgine (LAMICTAL) 100 MG tablet, Take 1 tablet (100 mg total) by mouth daily., Disp: 30 tablet, Rfl: 5   mometasone-formoterol (DULERA) 200-5 MCG/ACT AERO, Inhale 1 puff into the lungs daily. (Patient not taking: Reported on 05/24/2022), Disp: , Rfl:    omeprazole (PRILOSEC) 40 MG capsule, Take 1 capsule (40 mg total) by mouth daily. (Patient not taking: Reported on 02/07/2021), Disp: 90 capsule, Rfl: 3   traZODone (DESYREL) 100 MG tablet, Take 1 tablet (100 mg total) by mouth at bedtime as needed for sleep., Disp: 30 tablet, Rfl: 5 Medication Side Effects: none  Family Medical/ Social History: Changes? No  MENTAL HEALTH EXAM:  There were no vitals taken for this visit.There is no height or weight on file to calculate BMI.  General Appearance: Casual and Well Groomed  Eye Contact:  Good  Speech:  Clear and Coherent and Normal Rate  Volume:  Normal  Mood:  Euthymic  Affect:  Congruent  Thought Process:  Goal Directed and Descriptions of Associations: Circumstantial  Orientation:  Full (Time, Place, and Person)  Thought Content: Logical   Suicidal Thoughts:  No  Homicidal Thoughts:  No  Memory:  WNL  Judgement:  Good  Insight:  Good  Psychomotor Activity:  Normal  Concentration:  Concentration: Good and Attention Span: Good  Recall:  Good  Fund of Knowledge: Good  Language: Good  Assets:  Communication Skills Desire for Improvement Financial Resources/Insurance Housing Transportation Vocational/Educational  ADL's:  Intact  Cognition: WNL   Prognosis:  Good   DIAGNOSES:    ICD-10-CM   1. Attention deficit hyperactivity disorder (ADHD), unspecified ADHD type  F90.9     2. Generalized anxiety disorder  F41.1     3. Depression, major, recurrent, in partial remission ([place])  F33.41     4. Insomnia due to other mental disorder  F51.05    F99       Receiving Psychotherapy: No   RECOMMENDATIONS:  PDMP was reviewed.  Adderall filled 12/18/2021.  Testosterone known to me.   I provided 20 minutes of non-face-to-face time during this encounter, including time spent before and after the visit in records review, medical decision making, counseling pertinent to today's visit, and charting.   He is doing great on current medications so no changes will be made.  Continue Adderall 10 mg, 1 at breakfast and 1 at lunch. Continue BuSpar 30 mg, 1 p.o. twice daily. Cont Lexapro 20 mg  1.5 pills daily. Continue hydroxyzine 10 mg, 1-3 p.o. 3 times daily as needed. Continue Lamictal 100 mg, 1 qd. Continue trazodone 100 mg qhs as needed sleep. Return in 6 months.  Donnal Moat, PA-C

## 2022-05-25 ENCOUNTER — Other Ambulatory Visit: Payer: Self-pay

## 2022-08-15 ENCOUNTER — Other Ambulatory Visit: Payer: Self-pay | Admitting: Physician Assistant

## 2022-08-16 ENCOUNTER — Telehealth: Payer: Self-pay | Admitting: Physician Assistant

## 2022-08-16 ENCOUNTER — Other Ambulatory Visit: Payer: Self-pay | Admitting: Physician Assistant

## 2022-08-16 MED ORDER — AMPHETAMINE-DEXTROAMPHETAMINE 10 MG PO TABS: 10.0000 mg | ORAL_TABLET | Freq: Two times a day (BID) | ORAL | 0 refills | Status: DC

## 2022-08-16 NOTE — Telephone Encounter (Signed)
Next appt is 03/11/23. Requesting refill on Adderall 10 mg called to:  [id number] - [place], [place] - [address]   Phone: [phone]  Fax: [phone]    It is in stock per patient.

## 2022-08-16 NOTE — Telephone Encounter (Signed)
sent 

## 2022-10-19 ENCOUNTER — Ambulatory Visit (INDEPENDENT_AMBULATORY_CARE_PROVIDER_SITE_OTHER): Payer: Self-pay | Admitting: Physician Assistant

## 2022-10-19 DIAGNOSIS — Z91199 Patient's noncompliance with other medical treatment and regimen due to unspecified reason: Secondary | ICD-10-CM

## 2022-10-24 ENCOUNTER — Encounter: Payer: Self-pay | Admitting: Physician Assistant

## 2022-10-24 ENCOUNTER — Telehealth (INDEPENDENT_AMBULATORY_CARE_PROVIDER_SITE_OTHER): Payer: Medicaid Other | Admitting: Physician Assistant

## 2022-10-24 DIAGNOSIS — F5105 Insomnia due to other mental disorder: Secondary | ICD-10-CM

## 2022-10-24 DIAGNOSIS — F411 Generalized anxiety disorder: Secondary | ICD-10-CM | POA: Diagnosis not present

## 2022-10-24 DIAGNOSIS — F909 Attention-deficit hyperactivity disorder, unspecified type: Secondary | ICD-10-CM

## 2022-10-24 DIAGNOSIS — F3341 Major depressive disorder, recurrent, in partial remission: Secondary | ICD-10-CM | POA: Diagnosis not present

## 2022-10-24 DIAGNOSIS — F99 Mental disorder, not otherwise specified: Secondary | ICD-10-CM

## 2022-10-24 MED ORDER — AMPHETAMINE-DEXTROAMPHETAMINE 15 MG PO TABS: 15.0000 mg | ORAL_TABLET | Freq: Two times a day (BID) | ORAL | 0 refills | Status: AC

## 2022-10-24 MED ORDER — LAMOTRIGINE 100 MG PO TABS: 100.0000 mg | ORAL_TABLET | Freq: Every day | ORAL | 1 refills | Status: AC

## 2022-10-24 NOTE — Progress Notes (Signed)
[facility]  Patient ID: Jordan Robinson,  MRN: [id number]  PCP: Lewis Moccasin, MD  Date of Evaluation: 10/24/2022 Time spent: 22 minutes  Chief Complaint:  Chief Complaint   Anxiety; Depression; ADHD; Insomnia; Follow-up    Virtual Visit via Telehealth  I connected with patient by a video enabled telemedicine application, with their informed consent, and verified patient privacy and that I am speaking with the correct person using two identifiers.  I am private, in my office and the patient is in his car, private.  I discussed the limitations, risks, security and privacy concerns of performing an evaluation and management service by video and the availability of in person appointments. I also discussed with the patient that there may be a patient responsible charge related to this service. The patient expressed understanding and agreed to proceed.   I discussed the assessment and treatment plan with the patient. The patient was provided an opportunity to ask questions and all were answered. The patient agreed with the plan and demonstrated an understanding of the instructions.   The patient was advised to call back or seek an in-person evaluation if the symptoms worsen or if the condition fails to improve as anticipated.  I provided 22 minutes of non-face-to-face time during this encounter.  HISTORY/CURRENT STATUS: HPI  for routine med check.   States the Adderall is not working as well as it once did.  He does not feel like it is strong enough and does not last as long as it needs to for his regular work day.  He is wondering if the dose could be increased, if that is appropriate.  Patient is able to enjoy things.  Energy and motivation are good.  Work is going well.   No extreme sadness, tearfulness, or feelings of hopelessness.  Sleeps well but does need the trazodone every night.  He usually only takes 50 mg.  ADLs and personal hygiene are normal.   No change in  memory.  Appetite has not changed.  Weight is stable.  He does get anxious almost daily, he takes the hydroxyzine which is helpful.  Denies suicidal or homicidal thoughts.  Patient denies increased energy with decreased need for sleep, increased talkativeness, racing thoughts, impulsivity or risky behaviors, increased spending, increased libido, grandiosity, increased irritability or anger, paranoia, or hallucinations.  Denies dizziness, syncope, seizures, numbness, tingling, tremor, tics, unsteady gait, slurred speech, confusion. Denies muscle or joint pain, stiffness, or dystonia.  Individual Medical History/ Review of Systems: Changes? :No      Past medications for mental health diagnoses include: Adderall XR was effective, Adderall IR caused headache and stomach upset, Wellbutrin, Buspar, Lexapro, Trazodone, Topamax, Cymbalta, Adzenys   Allergies: Soy allergy  Current Medications:  Current Outpatient Medications:    acetaminophen (TYLENOL) 500 MG tablet, Take 500 mg by mouth every 6 (six) hours as needed. , Disp: , Rfl:    amphetamine-dextroamphetamine (ADDERALL) 15 MG tablet, Take 1 tablet by mouth 2 (two) times daily with breakfast and lunch., Disp: 60 tablet, Rfl: 0   busPIRone (BUSPAR) 30 MG tablet, Take 1 tablet (30 mg total) by mouth 2 (two) times daily., Disp: 60 tablet, Rfl: 11   cholecalciferol (VITAMIN D3) 25 MCG (1000 UNIT) tablet, Take 1,000 Units by mouth daily., Disp: , Rfl:    escitalopram (LEXAPRO) 20 MG tablet, Take 1.5 tablets (30 mg total) by mouth daily., Disp: 45 tablet, Rfl: 11   hydrOXYzine (ATARAX) 10 MG tablet, Take 1-3 tablets (10-30 mg total)  by mouth 3 (three) times daily as needed., Disp: 90 tablet, Rfl: 5   Omega-3 Fatty Acids (FISH OIL) 1000 MG CAPS, Take by mouth., Disp: , Rfl:    testosterone cypionate (DEPOTESTOSTERONE CYPIONATE) 200 MG/ML injection, Inject 100 mg into the skin once a week., Disp: , Rfl:    traZODone (DESYREL) 100 MG tablet, Take 1 tablet  (100 mg total) by mouth at bedtime as needed for sleep. (Patient taking differently: Take 50 mg by mouth at bedtime as needed for sleep.), Disp: 30 tablet, Rfl: 5   glucosamine-chondroitin 500-400 MG tablet, Take 1 tablet by mouth daily. (Patient not taking: Reported on 01/04/2020), Disp: , Rfl:    lamoTRIgine (LAMICTAL) 100 MG tablet, Take 1 tablet (100 mg total) by mouth daily., Disp: 90 tablet, Rfl: 1   mometasone-formoterol (DULERA) 200-5 MCG/ACT AERO, Inhale 1 puff into the lungs daily. (Patient not taking: Reported on 05/24/2022), Disp: , Rfl:    omeprazole (PRILOSEC) 40 MG capsule, Take 1 capsule (40 mg total) by mouth daily. (Patient not taking: Reported on 02/07/2021), Disp: 90 capsule, Rfl: 3 Medication Side Effects: none  Family Medical/ Social History: Changes? No  MENTAL HEALTH EXAM:  There were no vitals taken for this visit.There is no height or weight on file to calculate BMI.  General Appearance: Casual and Well Groomed  Eye Contact:  Good  Speech:  Clear and Coherent and Normal Rate  Volume:  Normal  Mood:  Euthymic  Affect:  Congruent  Thought Process:  Goal Directed and Descriptions of Associations: Circumstantial  Orientation:  Full (Time, Place, and Person)  Thought Content: Logical   Suicidal Thoughts:  No  Homicidal Thoughts:  No  Memory:  WNL  Judgement:  Good  Insight:  Good  Psychomotor Activity:  Normal  Concentration:  Concentration: Fair and Attention Span: Good  Recall:  Good  Fund of Knowledge: Good  Language: Good  Assets:  Communication Skills Desire for Improvement Financial Resources/Insurance Housing Transportation Vocational/Educational  ADL's:  Intact  Cognition: WNL  Prognosis:  Good   DIAGNOSES:    ICD-10-CM   1. Attention deficit hyperactivity disorder (ADHD), unspecified ADHD type  F90.9     2. Generalized anxiety disorder  F41.1     3. Depression, major, recurrent, in partial remission ([facility])  F33.41     4. Insomnia due to other  mental disorder  F51.05    F99       Receiving Psychotherapy: No   RECOMMENDATIONS:  PDMP was reviewed.  Adderall filled 08/16/2022.  Testosterone filled 05/08/2022. I provided 22 minutes of face to face time during this encounter, including time spent before and after the visit in records review, medical decision making, counseling pertinent to today's visit, and charting.   We discussed the fact that the Adderall is not as effective as it once was.  I agree that increasing the dose is appropriate.  He is doing well other than that so no change will be made.  Increase Adderall to 15 mg, 1 p.o. at breakfast and 1 p.o. at lunch. Continue BuSpar 30 mg, 1 p.o. twice daily. Cont Lexapro 20 mg  1.5 pills daily. Continue hydroxyzine 10 mg, 1-3 p.o. 3 times daily as needed. Continue Lamictal 100 mg, 1 qd. Continue trazodone 100 mg, 1/2-1 p.o. qhs as needed sleep. Return in 4 weeks.  Melony Overly, PA-C

## 2022-10-29 NOTE — Progress Notes (Signed)
No show

## 2022-11-23 ENCOUNTER — Telehealth: Payer: Medicaid Other | Admitting: Physician Assistant

## 2023-03-11 ENCOUNTER — Ambulatory Visit: Payer: 59 | Admitting: Physician Assistant
